# Patient Record
Sex: Female | Born: 1990 | Race: White | Hispanic: No | Marital: Married | Smoking: Former smoker
Health system: Southern US, Community
[De-identification: ages and names within clinical notes are randomized; demographics above are authoritative.]

## PROBLEM LIST (undated history)

## (undated) ENCOUNTER — Inpatient Hospital Stay: Payer: Self-pay

## (undated) DIAGNOSIS — D179 Benign lipomatous neoplasm, unspecified: Secondary | ICD-10-CM

## (undated) DIAGNOSIS — J45909 Unspecified asthma, uncomplicated: Secondary | ICD-10-CM

## (undated) DIAGNOSIS — G43909 Migraine, unspecified, not intractable, without status migrainosus: Secondary | ICD-10-CM

## (undated) DIAGNOSIS — R519 Headache, unspecified: Secondary | ICD-10-CM

## (undated) DIAGNOSIS — R51 Headache: Secondary | ICD-10-CM

## (undated) DIAGNOSIS — T7840XA Allergy, unspecified, initial encounter: Secondary | ICD-10-CM

## (undated) HISTORY — DX: Migraine, unspecified, not intractable, without status migrainosus: G43.909

## (undated) HISTORY — DX: Headache: R51

## (undated) HISTORY — DX: Benign lipomatous neoplasm, unspecified: D17.9

## (undated) HISTORY — DX: Headache, unspecified: R51.9

## (undated) HISTORY — DX: Unspecified asthma, uncomplicated: J45.909

## (undated) HISTORY — DX: Allergy, unspecified, initial encounter: T78.40XA

---
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
## 2009-09-01 ENCOUNTER — Ambulatory Visit: Payer: Self-pay | Admitting: Internal Medicine

## 2012-01-03 ENCOUNTER — Ambulatory Visit: Payer: Self-pay | Admitting: Internal Medicine

## 2012-06-06 ENCOUNTER — Emergency Department: Payer: Self-pay | Admitting: Emergency Medicine

## 2012-10-16 ENCOUNTER — Ambulatory Visit: Payer: Self-pay | Admitting: Family Medicine

## 2013-11-21 IMAGING — US ULTRASOUND RIGHT BREAST
1 series · 13 of 13 positions shown · non-contrast
Comparison: none

REASON FOR EXAM: RT BRST LUMP 7 OCLOCK
COMMENTS:

PROCEDURE:     US  - US BREAST RIGHT  - January 03, 2012 [DATE]
RESULT:     The patient reports an area of nodularity on the right at the
[DATE] to [DATE] position. The echotexture of the image parenchyma is normal. No
cystic or solid masses are demonstrated. There is no abnormal shadowing.

[Series 1: ultrasound right breast · 0.08mm/px · 13 of 13 slices shown]
[im 1/13]
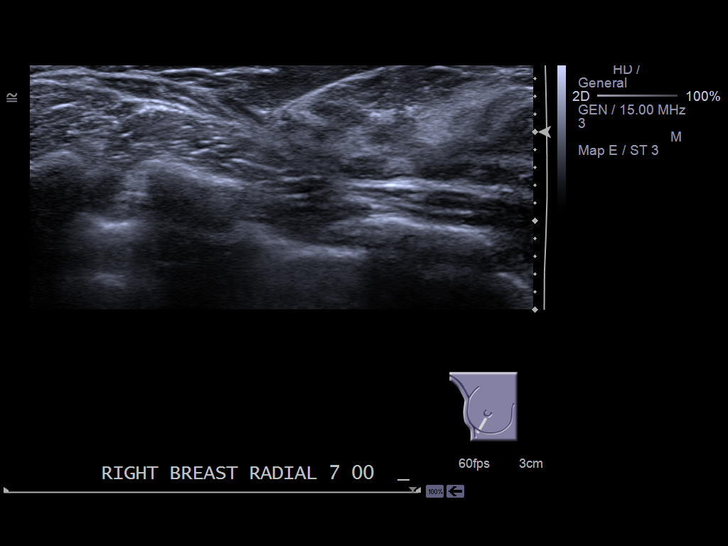
[im 2/13]
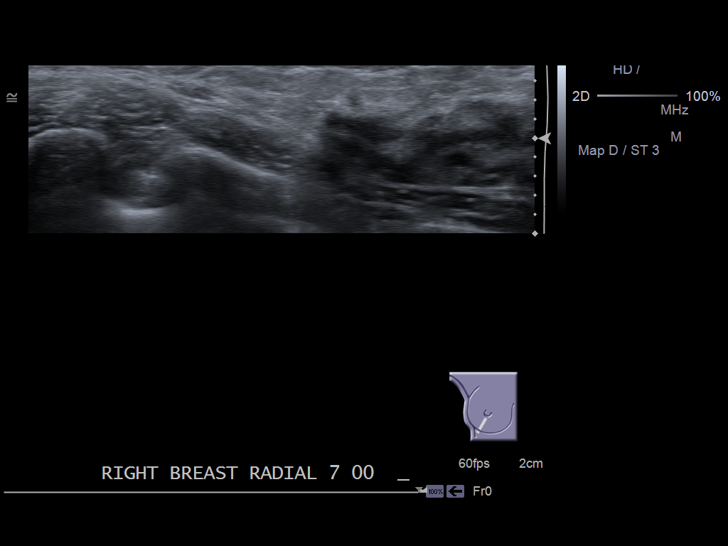
[im 3/13]
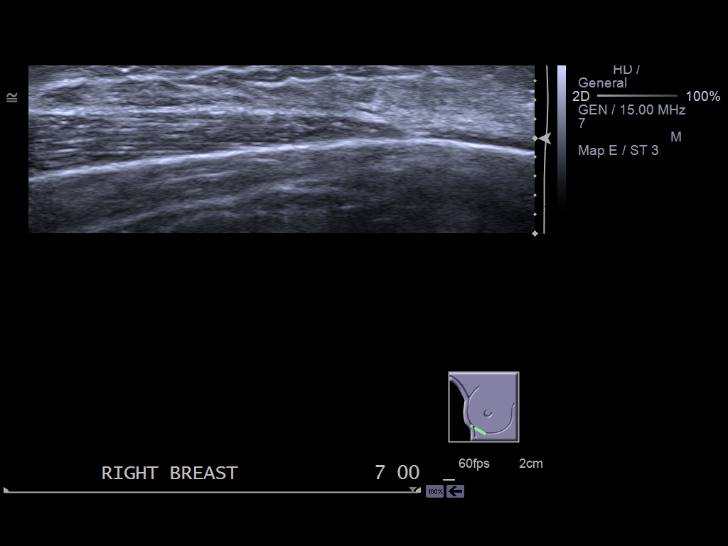
[im 4/13]
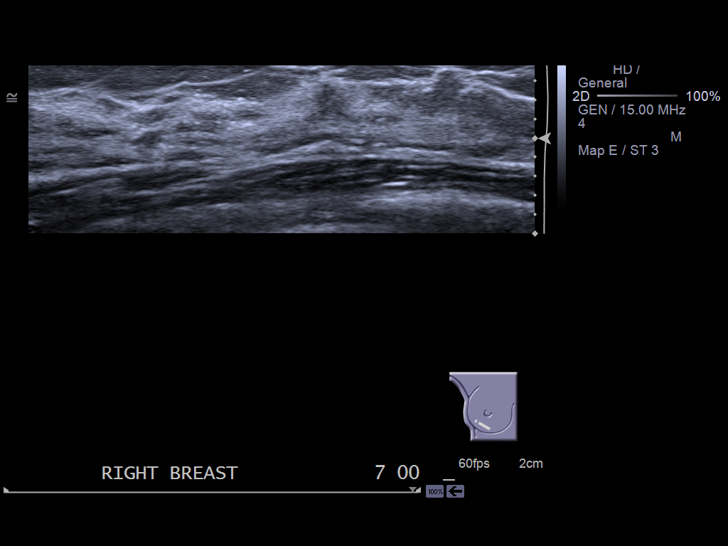
[im 5/13]
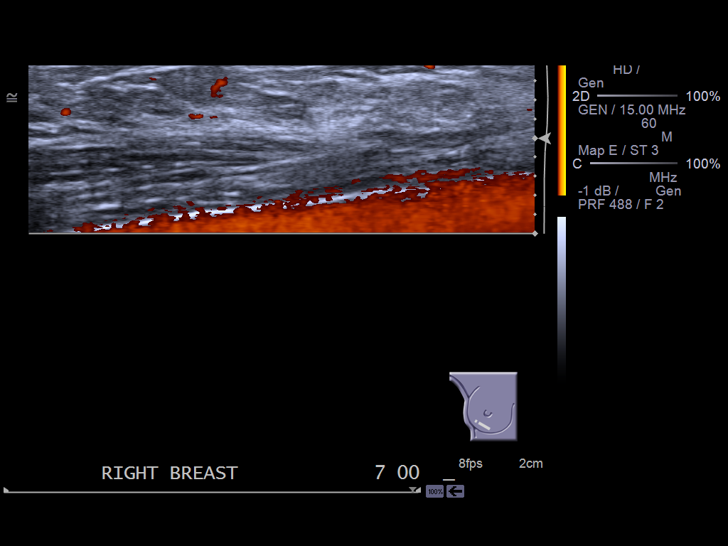
[im 6/13]
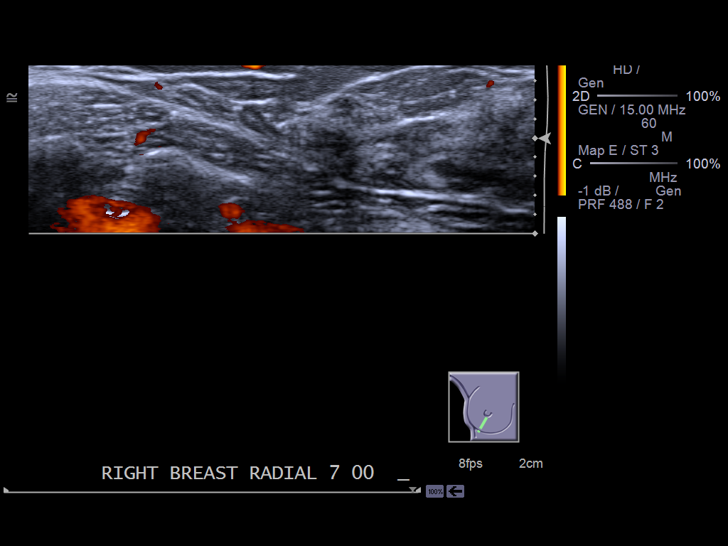
[im 7/13]
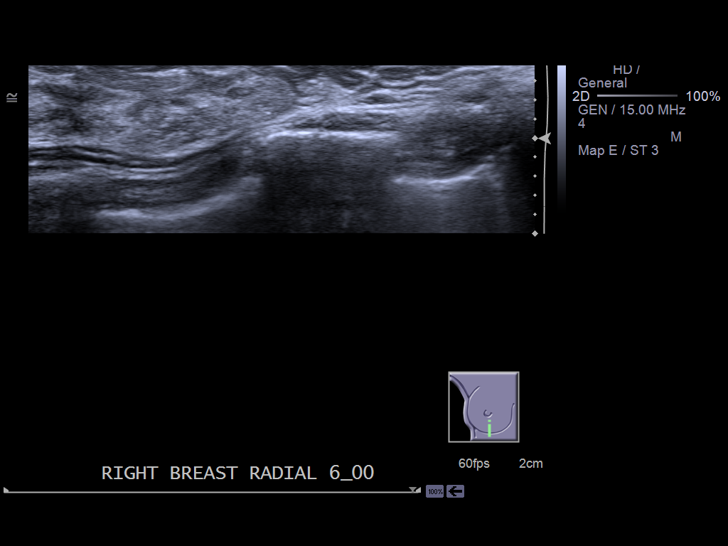
[im 8/13]
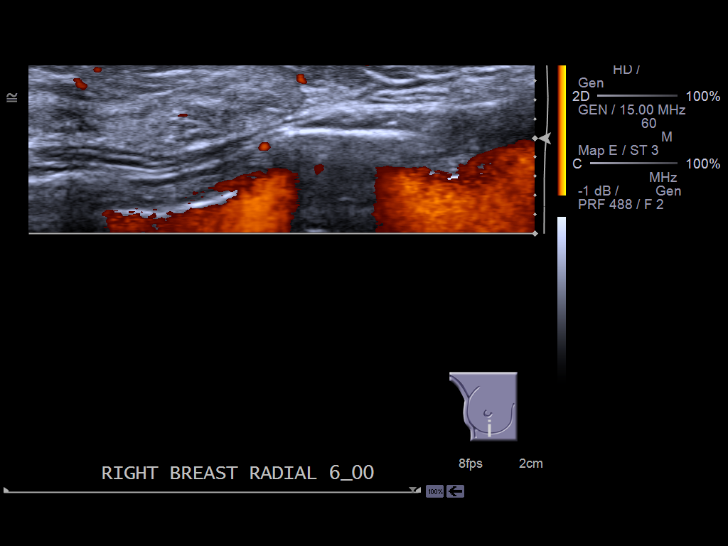
[im 9/13]
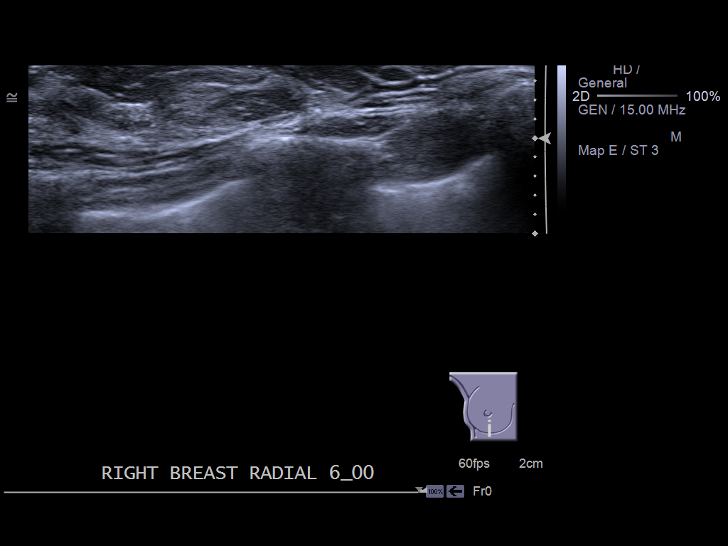
[im 10/13]
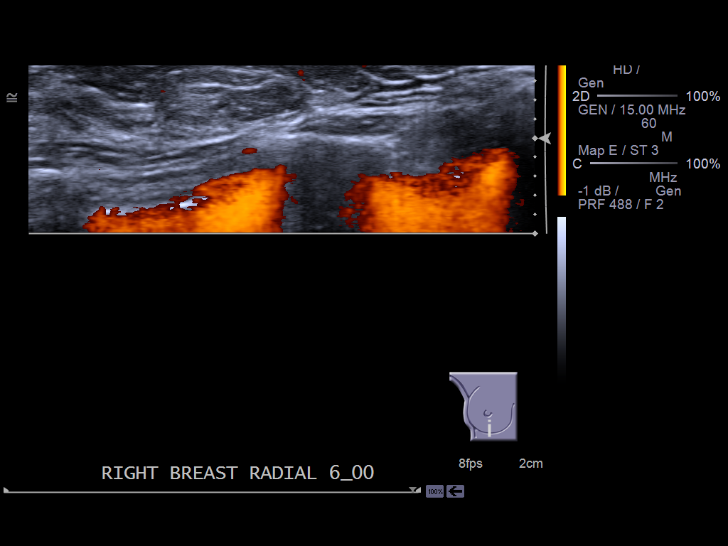
[im 11/13]
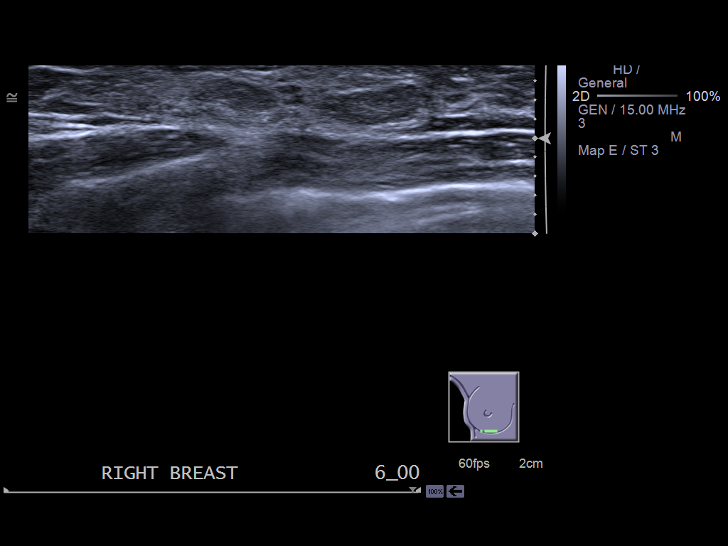
[im 12/13]
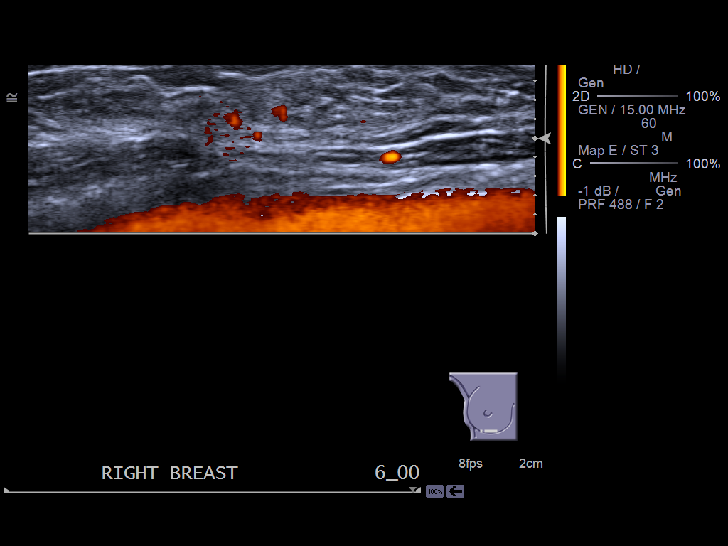
[im 13/13]
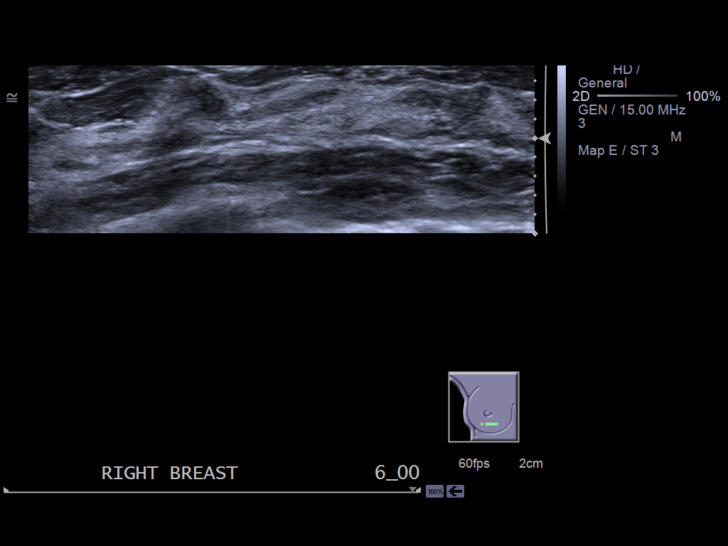

[13 of 13 positions shown; findings below may reference images not displayed]

IMPRESSION: No abnormality is demonstrated on this directed ultrasound
of the right breast. Continued close clinical followup is recommended.
Surgical consultation is recommended with consideration of percutaneous
biopsy of any persistent palpable finding.

[REDACTED]

## 2014-06-13 ENCOUNTER — Other Ambulatory Visit: Payer: Self-pay

## 2014-06-30 ENCOUNTER — Ambulatory Visit: Payer: Self-pay | Admitting: Certified Nurse Midwife

## 2014-06-30 LAB — HCG, QUANTITATIVE, PREGNANCY: Beta Hcg, Quant.: 3 m[IU]/mL

## 2015-04-05 HISTORY — PX: WISDOM TOOTH EXTRACTION: SHX21

## 2016-11-16 ENCOUNTER — Encounter: Payer: Self-pay | Admitting: Primary Care

## 2016-11-16 ENCOUNTER — Ambulatory Visit (INDEPENDENT_AMBULATORY_CARE_PROVIDER_SITE_OTHER): Payer: 59 | Admitting: Primary Care

## 2016-11-16 DIAGNOSIS — G43701 Chronic migraine without aura, not intractable, with status migrainosus: Secondary | ICD-10-CM | POA: Diagnosis not present

## 2016-11-16 DIAGNOSIS — G43909 Migraine, unspecified, not intractable, without status migrainosus: Secondary | ICD-10-CM | POA: Insufficient documentation

## 2016-11-16 NOTE — Assessment & Plan Note (Signed)
Chronic, intermittent. Recent, daily migraines since August 4th. Exam today unremarkable. Recommended a few treatment options including migraine cocktail in the clinic today and if no resolve then preventative, daily treatment. Since she will be seeing a neurologist later today then she will wait for treatment.

## 2016-11-16 NOTE — Progress Notes (Signed)
Subjective:    Patient ID: Tina Fischer, female    DOB: 1990/06/10, 26 y.o.   MRN: [id number]  HPI  Tina Fischer is a 26 year ol female who presents today to establish care and discuss the problems mentioned below. Will obtain old records.   1) Chronic Headaches/Migraines: Diagnosed at the age of 26. Currently managed on butalbital-acetaminophen-caffeine and sumatriptan for which she recently was prescribed at a walk-in clinic. Migraines and headaches have been intermittent for years, started back up in March 2017, then no headaches/migraines until August 4th 2018.   Since August 4th her migraines are occurring 2-3 times daily with nausea, vomiting, photophobia. She was evaluated at the walk in clinic and was provided with a 5 day steroid course with resolve of mirgaines until she ran out. She also was provided with butalbital-acetaminophen-caffeine and Sumatriptan. Neither of these medications have helped. She denies increased stress/anxiety, changes in soaps/detergents, new perfumes.   Her last migraine was this morning, still feels residual pain to her left neck. Migraines are typically located to the left occipital lobe, left parietal lobe, and left frontal lobe. She has an appointment with a Neurologist later today.   2) Asthma: Diagnosed in childhood. Doesn't experience symptoms as an adult. Will occasionally notice wheezing with vigorous exercise. Quit smoking in July 2018.  Review of Systems  Eyes: Positive for photophobia.  Respiratory: Negative for shortness of breath and wheezing.   Cardiovascular: Negative for chest pain.  Gastrointestinal: Positive for nausea and vomiting.  Neurological: Positive for headaches.       Past Medical History:  Diagnosis Date  . Allergy   . Asthma   . Frequent headaches   . Migraines      Social History   Social History  . Marital status: Single    Spouse name: N/A  . Number of children: N/A  . Years of education: N/A    Occupational History  . Not on file.   Social History Main Topics  . Smoking status: Former Research scientist (life sciences)  . Smokeless tobacco: Never Used  . Alcohol use Yes  . Drug use: Unknown  . Sexual activity: Not on file   Other Topics Concern  . Not on file   Social History Narrative   Married.   No children.   Works for [employer], yoga, Chiropodist.     Past Surgical History:  Procedure Laterality Date  . TONSILLECTOMY AND ADENOIDECTOMY  2008  . WISDOM TOOTH EXTRACTION  2017    Family History  Problem Relation Age of Onset  . Cancer Maternal Grandmother        breast  . Early death Maternal Grandmother   . Early death Maternal Grandfather   . Depression Paternal Grandmother   . Depression Paternal Grandfather   . Cancer Paternal Grandfather        Prostated    No Known Allergies  No current outpatient prescriptions on file prior to visit.   No current facility-administered medications on file prior to visit.     BP 104/70   Pulse 70   Temp 97.7 F (36.5 C) (Oral)   Ht 5' 4.5" (1.638 m)   Wt 117 lb 12.8 oz (53.4 kg)   LMP 10/28/2016   SpO2 98%   BMI 19.91 kg/m    Objective:   Physical Exam  Constitutional: She appears well-nourished.  Eyes: EOM are normal.  Neck: Neck supple.  Cardiovascular: Normal rate and regular rhythm.   Pulmonary/Chest:  Effort normal and breath sounds normal.  Neurological: No cranial nerve deficit.  Skin: Skin is warm and dry.  Psychiatric: She has a normal mood and affect.          Assessment & Plan:

## 2016-11-16 NOTE — Patient Instructions (Signed)
Follow up with Neurology as scheduled.  It was a pleasure to meet you today! Please don't hesitate to call me with any questions. Welcome to [facility]!

## 2016-12-13 ENCOUNTER — Telehealth: Payer: Self-pay

## 2016-12-13 DIAGNOSIS — J4520 Mild intermittent asthma, uncomplicated: Secondary | ICD-10-CM

## 2016-12-13 DIAGNOSIS — J3089 Other allergic rhinitis: Secondary | ICD-10-CM

## 2016-12-13 MED ORDER — ALBUTEROL SULFATE HFA 108 (90 BASE) MCG/ACT IN AERS: 2.0000 | INHALATION_SPRAY | RESPIRATORY_TRACT | 0 refills | Status: DC | PRN

## 2016-12-13 NOTE — Telephone Encounter (Signed)
Noted. Given her history of asthma and allergic rhinitis will send perception for albuterol inhaler to pharmacy. Please instruct her to use 2 puffs every 4-6 hours as needed for wheezing or shortness of breath. Please have her come see me if no improvement the next several days.

## 2016-12-13 NOTE — Telephone Encounter (Signed)
Pt said her allergies are causing a head cold with wheezing;pt has used albuterol inhaler which helps and pt request albuterol inhaler to [facility]. No fever, no cough and if pt worsens will schedule appt but does not want to schedule appt now. Please advise. Established care 11/16/16.

## 2016-12-14 NOTE — Telephone Encounter (Signed)
Spoken and notified patient of Kate's comments. Patient verbalized understanding. 

## 2017-11-16 ENCOUNTER — Encounter: Payer: Self-pay | Admitting: Primary Care

## 2017-11-16 ENCOUNTER — Ambulatory Visit (INDEPENDENT_AMBULATORY_CARE_PROVIDER_SITE_OTHER): Payer: 59 | Admitting: Primary Care

## 2017-11-16 ENCOUNTER — Other Ambulatory Visit (HOSPITAL_COMMUNITY)
Admission: RE | Admit: 2017-11-16 | Discharge: 2017-11-16 | Disposition: A | Discharge status: Home / Self Care | Payer: 59 | Source: Ambulatory Visit | Attending: Family Medicine | Admitting: Family Medicine

## 2017-11-16 VITALS — BP 106/70 | HR 66 | Temp 98.0°F | Ht 64.5 in | Wt 113.8 lb

## 2017-11-16 DIAGNOSIS — J3089 Other allergic rhinitis: Secondary | ICD-10-CM | POA: Diagnosis not present

## 2017-11-16 DIAGNOSIS — G43701 Chronic migraine without aura, not intractable, with status migrainosus: Secondary | ICD-10-CM | POA: Diagnosis not present

## 2017-11-16 DIAGNOSIS — Z124 Encounter for screening for malignant neoplasm of cervix: Secondary | ICD-10-CM | POA: Insufficient documentation

## 2017-11-16 DIAGNOSIS — J4520 Mild intermittent asthma, uncomplicated: Secondary | ICD-10-CM

## 2017-11-16 DIAGNOSIS — F909 Attention-deficit hyperactivity disorder, unspecified type: Secondary | ICD-10-CM

## 2017-11-16 DIAGNOSIS — J4530 Mild persistent asthma, uncomplicated: Secondary | ICD-10-CM | POA: Insufficient documentation

## 2017-11-16 DIAGNOSIS — J309 Allergic rhinitis, unspecified: Secondary | ICD-10-CM | POA: Insufficient documentation

## 2017-11-16 MED ORDER — MONTELUKAST SODIUM 10 MG PO TABS: ORAL_TABLET | ORAL | 0 refills | Status: DC

## 2017-11-16 MED ORDER — ALBUTEROL SULFATE HFA 108 (90 BASE) MCG/ACT IN AERS: 2.0000 | INHALATION_SPRAY | RESPIRATORY_TRACT | 0 refills | Status: DC | PRN

## 2017-11-16 NOTE — Assessment & Plan Note (Signed)
Daily symptoms since getting a kitten. Using Zyrtec and albuterol daily. Rx for Singulair sent to pharmacy. She will update if she continues to require frequent use of her albuterol, consider ICS inhaler at that point.

## 2017-11-16 NOTE — Progress Notes (Signed)
Subjective:    Patient ID: Tina Fischer, female    DOB: Aug 13, 1990, 27 y.o.   MRN: [id number]  HPI  Tina Fischer is a 27 year old female who presents today for follow up and pap smear.  1) Asthma: Managed on Zyrtec for which she takes daily. She is allergic to cat's, recently got a kitten. She's been using her albuterol inhaler several times daily for the last 40 days since she got her kitten. She'll also using prior to exercise. She'll experience wheezing, shortness of breath, and chest tightness before using inhaler.   2) Migraines: Currently managed on Sumatriptan for which she's not taken in nearly one year as she's had no migraine. She had an ear piercing about one year ago.   3) Screening for cervical cancer: Due for pap smear today. Her LMP was one week ago. She doing self breast exams weekly. She is on no birth control .   4) ADHD: History of since childhood, once managed on Adderall. 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 She denies formal testing, recently. She's been off of her medication for years.   Review of Systems  Constitutional: Negative for fever.  Respiratory: Positive for shortness of breath and wheezing.   Genitourinary: Negative for menstrual problem.  Neurological: Negative for headaches.       Past Medical History:  Diagnosis Date  . Allergy   . Asthma   . Frequent headaches   . Migraines      Social History   Socioeconomic History  . Marital status: Single    Spouse name: Not on file  . Number of children: Not on file  . Years of education: Not on file  . Highest education level: Not on file  Occupational History  . Not on file  Social Needs  . Financial resource strain: Not on file  . Food insecurity:    Worry: Not on file    Inability: Not on file  . Transportation needs:    Medical: Not on file    Non-medical: Not on file  Tobacco Use  . Smoking status: Former Research scientist (life sciences)  .  Smokeless tobacco: Never Used  Substance and Sexual Activity  . Alcohol use: Yes  . Drug use: Not on file  . Sexual activity: Not on file  Lifestyle  . Physical activity:    Days per week: Not on file    Minutes per session: Not on file  . Stress: Not on file  Relationships  . Social connections:    Talks on phone: Not on file    Gets together: Not on file    Attends religious service: Not on file    Active member of club or organization: Not on file    Attends meetings of clubs or organizations: Not on file    Relationship status: Not on file  . Intimate partner violence:    Fear of current or ex partner: Not on file    Emotionally abused: Not on file    Physically abused: Not on file    Forced sexual activity: Not on file  Other Topics Concern  . Not on file  Social History Narrative   Married.   No children.   Works for [employer], yoga, Chiropodist.      Family History  Problem Relation Age of Onset  . Cancer Maternal Grandmother        breast  .  Early death Maternal Grandmother   . Early death Maternal Grandfather   . Depression Paternal Grandmother   . Depression Paternal Grandfather   . Cancer Paternal Grandfather        Prostated    No Known Allergies  Current Outpatient Medications on File Prior to Visit  Medication Sig Dispense Refill  . cetirizine (ZYRTEC) 10 MG tablet Take by mouth.    . SUMAtriptan (IMITREX) 50 MG tablet Take 1 tablet once daily as needed for migraine for up 1 dose. May take 2nd after 2 hours if needed     No current facility-administered medications on file prior to visit.     BP 106/70   Pulse 66   Temp 98 F (36.7 C) (Oral)   Ht 5' 4.5" (1.638 m)   Wt 113 lb 12 oz (51.6 kg)   LMP 11/13/2017   SpO2 99%   BMI 19.22 kg/m    Objective:   Physical Exam  Constitutional: She appears well-nourished.  Neck: Neck supple.  Cardiovascular: Normal rate and regular rhythm.  Respiratory: Effort normal and  breath sounds normal.  Genitourinary: Cervix exhibits no motion tenderness and no discharge. Right adnexum displays no tenderness. Left adnexum displays no tenderness. No erythema in the vagina. No vaginal discharge found.  Skin: Skin is warm and dry.  Psychiatric: She has a normal mood and affect.           Assessment & Plan:  Screening for Cervical Cancer:  Pap smear completed today. She completes monthly breast exams. Menstrual cycles regular.  No birth control.   Tina Koch, NP

## 2017-11-16 NOTE — Patient Instructions (Signed)
Your asthma is not well controlled.  Start montelukast (Singular) 10 mg tablets for allergies and asthma.   Use the albuterol inhaler as needed for wheezing and shortness of breath. Please call me if you continue to require use of your inhaler more than three times weekly.  We will be in touch once we receive your pap smear results.   It was a pleasure to see you today!

## 2017-11-16 NOTE — Assessment & Plan Note (Signed)
Endorses history of since childhood, last management with Adderall was in 2014. Records in [place]. She will start school to see how things go. If interested in restarting medication, will send for formal testing.

## 2017-11-16 NOTE — Assessment & Plan Note (Signed)
No migraine in nearly one year since ear piercing. Continue to monitor.

## 2017-11-17 LAB — CYTOLOGY - PAP: Diagnosis: NEGATIVE

## 2017-11-20 ENCOUNTER — Encounter: Payer: Self-pay | Admitting: *Deleted

## 2018-01-15 ENCOUNTER — Telehealth: Payer: Self-pay

## 2018-01-15 NOTE — Telephone Encounter (Signed)
Copied from Linda [id number]. 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  Pt needs chicken pox vaccination and meningococcal vaccination.  Pt can be reached at [phone]

## 2018-01-15 NOTE — Telephone Encounter (Signed)
Does pt need titer or just schedule nurse visit for injections? Pt last seen 11/16/17 and copy of [facility] in Kate's inbox.

## 2018-01-16 NOTE — Telephone Encounter (Signed)
Reviewed immunization history and don't see evidence of either vaccination. We can either test her for immunity or give the vaccinations, I'll leave this up to her. Let me know.

## 2018-01-16 NOTE — Telephone Encounter (Signed)
Pt states she needs to get these vaccines or she cannot continue dental school, so doing a lab is out of the question. Pt states no, she never got any of these vaccines as a child.  Pt would like to come in on Friday, at 11 am. No other times available next week. It that OK? Pt states she needs to get them by then if that will work for you.

## 2018-01-16 NOTE — Telephone Encounter (Signed)
Vaccines are fine with me, please call her back for scheduling.

## 2018-01-16 NOTE — Telephone Encounter (Signed)
Patient calling and states that she received a call, but not sure what it was regarding. Please advise. Patient would like to have the vaccines on Friday 01/19/18 at 11:00am, if that is okay with K. Clark.

## 2018-01-17 NOTE — Telephone Encounter (Signed)
I spoke with pt and scheduled nurse visit on 01/25/18 at [facility] for varicella and meningococcal vaccines. Pt may ck with instructor to see if can have varicella titer; if so pt will cb for lab appt.

## 2018-01-18 ENCOUNTER — Ambulatory Visit: Payer: Self-pay | Admitting: Family Medicine

## 2018-01-25 ENCOUNTER — Encounter (INDEPENDENT_AMBULATORY_CARE_PROVIDER_SITE_OTHER): Payer: Self-pay

## 2018-01-25 ENCOUNTER — Ambulatory Visit: Payer: Self-pay | Admitting: *Deleted

## 2018-01-25 DIAGNOSIS — Z23 Encounter for immunization: Secondary | ICD-10-CM

## 2018-02-15 ENCOUNTER — Other Ambulatory Visit: Payer: Self-pay | Admitting: Primary Care

## 2018-02-15 DIAGNOSIS — J4520 Mild intermittent asthma, uncomplicated: Secondary | ICD-10-CM

## 2018-02-15 DIAGNOSIS — J3089 Other allergic rhinitis: Secondary | ICD-10-CM

## 2018-02-16 ENCOUNTER — Ambulatory Visit: Payer: Self-pay | Admitting: Primary Care

## 2018-02-23 ENCOUNTER — Ambulatory Visit: Payer: Self-pay | Admitting: Primary Care

## 2018-02-23 ENCOUNTER — Encounter: Payer: Self-pay | Admitting: Primary Care

## 2018-02-23 VITALS — BP 118/72 | HR 86 | Temp 98.1°F | Ht 64.5 in | Wt 117.5 lb

## 2018-02-23 DIAGNOSIS — Z23 Encounter for immunization: Secondary | ICD-10-CM

## 2018-02-23 DIAGNOSIS — D171 Benign lipomatous neoplasm of skin and subcutaneous tissue of trunk: Secondary | ICD-10-CM

## 2018-02-23 DIAGNOSIS — D179 Benign lipomatous neoplasm, unspecified: Secondary | ICD-10-CM | POA: Insufficient documentation

## 2018-02-23 NOTE — Progress Notes (Signed)
Subjective:    Patient ID: Tina Fischer, female    DOB: 08-01-1990, 27 y.o.   MRN: [id number]  HPI  Tina Fischer is a 27 year old female who presents today with a chief complaint of skin mass. The mass is located to the right upper back for which she noticed in October this year. Her husband has noticed it over the past one year. Over the past 2 weeks she's noticed the mass grow in size.   She denies pain, itching, redness.   Review of Systems  Constitutional: Negative for fever.  Skin: Negative for color change and wound.       Skin mass       Past Medical History:  Diagnosis Date  . Allergy   . Asthma   . Frequent headaches   . Migraines      Social History   Socioeconomic History  . Marital status: Single    Spouse name: Not on file  . Number of children: Not on file  . Years of education: Not on file  . Highest education level: Not on file  Occupational History  . Not on file  Social Needs  . Financial resource strain: Not on file  . Food insecurity:    Worry: Not on file    Inability: Not on file  . Transportation needs:    Medical: Not on file    Non-medical: Not on file  Tobacco Use  . Smoking status: Former Research scientist (life sciences)  . Smokeless tobacco: Never Used  Substance and Sexual Activity  . Alcohol use: Yes  . Drug use: Not on file  . Sexual activity: Not on file  Lifestyle  . Physical activity:    Days per week: Not on file    Minutes per session: Not on file  . Stress: Not on file  Relationships  . Social connections:    Talks on phone: Not on file    Gets together: Not on file    Attends religious service: Not on file    Active member of club or organization: Not on file    Attends meetings of clubs or organizations: Not on file    Relationship status: Not on file  . Intimate partner violence:    Fear of current or ex partner: Not on file    Emotionally abused: Not on file    Physically abused: Not on file    Forced sexual activity: Not on file    Other Topics Concern  . Not on file  Social History Narrative   Married.   No children.   Works for [employer], yoga, Chiropodist.     Family History  Problem Relation Age of Onset  . Cancer Maternal Grandmother        breast  . Early death Maternal Grandmother   . Early death Maternal Grandfather   . Depression Paternal Grandmother   . Depression Paternal Grandfather   . Cancer Paternal Grandfather        Prostated    No Known Allergies  Current Outpatient Medications on File Prior to Visit  Medication Sig Dispense Refill  . albuterol (PROVENTIL HFA;VENTOLIN HFA) 108 (90 Base) MCG/ACT inhaler Inhale 2 puffs into the lungs every 4 (four) hours as needed for wheezing or shortness of breath. 1 Inhaler 0  . cetirizine (ZYRTEC) 10 MG tablet Take by mouth.    . montelukast (SINGULAIR) 10 MG tablet TAKE 1 TABLET BY MOUTH AT BEDTIME FOR ALLERGIES  AND ASTHMA. 90 tablet 0  . SUMAtriptan (IMITREX) 50 MG tablet Take 1 tablet once daily as needed for migraine for up 1 dose. May take 2nd after 2 hours if needed     No current facility-administered medications on file prior to visit.     BP 118/72   Pulse 86   Temp 98.1 F (36.7 C) (Oral)   Ht 5' 4.5" (1.638 m)   Wt 117 lb 8 oz (53.3 kg)   LMP 02/11/2018   SpO2 98%   BMI 19.86 kg/m    Objective:   Physical Exam  Constitutional: She appears well-nourished.  Cardiovascular: Normal rate and regular rhythm.  Respiratory: Effort normal and breath sounds normal.  Skin: Skin is warm and dry. No erythema.  0.5 cm rounded, soft superficial mass to right mid/lower scapular region.  Nontender.           Assessment & Plan:

## 2018-02-23 NOTE — Patient Instructions (Signed)
The mass on your back is likely a benign cyst.  Lipoma A lipoma is a noncancerous (benign) tumor that is made up of fat cells. This is a very common type of soft-tissue growth. Lipomas are usually found under the skin (subcutaneous). They may occur in any tissue of the body that contains fat. Common areas for lipomas to appear include the back, shoulders, buttocks, and thighs. Lipomas grow slowly, and they are usually painless. Most lipomas do not cause problems and do not require treatment. What are the causes? The cause of this condition is not known. What increases the risk? This condition is more likely to develop in:  People who are 64-46 years old.  People who have a family history of lipomas.  What are the signs or symptoms? A lipoma usually appears as a small, round bump under the skin. It may feel soft or rubbery, but the firmness can vary. Most lipomas are not painful. However, a lipoma may become painful if it is located in an area where it pushes on nerves. How is this diagnosed? A lipoma can usually be diagnosed with a physical exam. You may also have tests to confirm the diagnosis and to rule out other conditions. Tests may include:  Imaging tests, such as a CT scan or MRI.  Removal of a tissue sample to be looked at under a microscope (biopsy).  How is this treated? Treatment is not needed for small lipomas that are not causing problems. If a lipoma continues to get bigger or it causes problems, removal is often the best option. Lipomas can also be removed to improve appearance. Removal of a lipoma is usually done with a surgery in which the fatty cells and the surrounding capsule are removed. Most often, a medicine that numbs the area (local anesthetic) is used for this procedure. Follow these instructions at home:  Keep all follow-up visits as directed by your health care provider. This is important. Contact a health care provider if:  Your lipoma becomes larger or  hard.  Your lipoma becomes painful, red, or increasingly swollen. These could be signs of infection or a more serious condition. This information is not intended to replace advice given to you by your health care provider. Make sure you discuss any questions you have with your health care provider. Document Released: 03/11/2002 Document Revised: 08/27/2015 Document Reviewed: 03/17/2014 Elsevier Interactive Patient Education  [employer].

## 2018-02-23 NOTE — Assessment & Plan Note (Addendum)
Located to posterior trunk at right scapular region.  Exam today representative of benign cyst/lipoma.  Reassurance provided.  Continue to monitor.

## 2018-07-09 ENCOUNTER — Other Ambulatory Visit: Payer: Self-pay | Admitting: Primary Care

## 2018-07-09 DIAGNOSIS — J4520 Mild intermittent asthma, uncomplicated: Secondary | ICD-10-CM

## 2018-07-09 DIAGNOSIS — J3089 Other allergic rhinitis: Secondary | ICD-10-CM

## 2018-10-10 ENCOUNTER — Other Ambulatory Visit (HOSPITAL_COMMUNITY)
Admission: RE | Admit: 2018-10-10 | Discharge: 2018-10-10 | Disposition: A | Discharge status: Home / Self Care | Payer: Medicaid Other | Source: Ambulatory Visit | Attending: Obstetrics and Gynecology | Admitting: Obstetrics and Gynecology

## 2018-10-10 ENCOUNTER — Ambulatory Visit: Payer: Self-pay | Admitting: Obstetrics and Gynecology

## 2018-10-10 ENCOUNTER — Encounter: Payer: Self-pay | Admitting: Obstetrics and Gynecology

## 2018-10-10 ENCOUNTER — Other Ambulatory Visit: Payer: Self-pay

## 2018-10-10 VITALS — BP 132/75 | HR 86 | Wt 131.0 lb

## 2018-10-10 DIAGNOSIS — N912 Amenorrhea, unspecified: Secondary | ICD-10-CM

## 2018-10-10 DIAGNOSIS — Z113 Encounter for screening for infections with a predominantly sexual mode of transmission: Secondary | ICD-10-CM | POA: Insufficient documentation

## 2018-10-10 DIAGNOSIS — J45909 Unspecified asthma, uncomplicated: Secondary | ICD-10-CM

## 2018-10-10 DIAGNOSIS — O99511 Diseases of the respiratory system complicating pregnancy, first trimester: Secondary | ICD-10-CM | POA: Diagnosis not present

## 2018-10-10 DIAGNOSIS — Z3A01 Less than 8 weeks gestation of pregnancy: Secondary | ICD-10-CM | POA: Insufficient documentation

## 2018-10-10 DIAGNOSIS — Z3401 Encounter for supervision of normal first pregnancy, first trimester: Secondary | ICD-10-CM | POA: Insufficient documentation

## 2018-10-10 DIAGNOSIS — Z3201 Encounter for pregnancy test, result positive: Secondary | ICD-10-CM

## 2018-10-10 DIAGNOSIS — O99519 Diseases of the respiratory system complicating pregnancy, unspecified trimester: Secondary | ICD-10-CM

## 2018-10-10 DIAGNOSIS — Z3400 Encounter for supervision of normal first pregnancy, unspecified trimester: Secondary | ICD-10-CM | POA: Insufficient documentation

## 2018-10-10 HISTORY — DX: Unspecified asthma, uncomplicated: J45.909

## 2018-10-10 HISTORY — DX: Diseases of the respiratory system complicating pregnancy, unspecified trimester: O99.519

## 2018-10-10 LAB — POCT URINE PREGNANCY: Preg Test, Ur: POSITIVE — AB

## 2018-10-10 NOTE — Progress Notes (Signed)
New Obstetric Patient H&P   Chief Complaint: "Desires prenatal care"   History of Present Illness: Patient is a 28 y.o. G2P0010 Not Hispanic or [place] female, LMP 08/26/2018 presents with amenorrhea and positive home pregnancy test. Based on her  LMP, her EDD is Estimated Date of Delivery: 06/02/2019 and her EGA is [redacted]w[redacted]d. Cycles are 4. days, regular, and occur approximately every : 28 days. Her last pap smear was 11/16/2017 and was no abnormalities.    She had a urine pregnancy test which was positive 3 or 4 week(s)  ago. Her last menstrual period was normal and lasted for  3 day(s). Since her LMP she claims she has experienced bloating and constipation along with stomach cramps. She denies vaginal bleeding. Her past medical history is notable for asthma. Her prior pregnancies are notable for early miscarriage  Since her LMP, she admits to the use of tobacco products  no She claims she has gained 7 pounds since the start of her pregnancy.  There are cats in the home in the home  yes If yes Indoor She admits close contact with children on a regular basis  yes  She has had chicken pox in the past yes She has had Tuberculosis exposures, symptoms, or previously tested positive for TB   no Current or past history of domestic violence. no  Genetic Screening/Teratology Counseling: (Includes patient, baby's father, or anyone in either family with:)   75. Patient's age >/= 20 [facility]  no 2. Thalassemia ([place], [place], [place], or Asian background): MCV<80  no 3. Neural tube defect (meningomyelocele, spina bifida, anencephaly)  no 4. Congenital heart defect  no  5. Down syndrome  no 6. Tay-Sachs (Jewish, [place])  no 7. Canavan's Disease  no 8. Sickle cell disease or trait (African)  no  9. Hemophilia or other blood disorders  no  10. Muscular dystrophy  no  11. Cystic fibrosis  no  12. Huntington's Chorea  no  13. Mental retardation/autism  no 14. Other inherited genetic or  chromosomal disorder  no 15. Maternal metabolic disorder (DM, PKU, etc)  no 16. Patient or FOB with a child with a birth defect not listed above no  16a. Patient or FOB with a birth defect themselves no 17. Recurrent pregnancy loss, or stillbirth  no  18. Any medications since LMP other than prenatal vitamins (include vitamins, supplements, OTC meds, drugs, alcohol)  no 19. Any other genetic/environmental exposure to discuss  no  Infection History:   1. Lives with someone with TB or TB exposed  no  2. Patient or partner has history of genital herpes  no 3. Rash or viral illness since LMP  no 4. History of STI (GC, CT, HPV, syphilis, HIV)  no 5. History of recent travel :  no  Other pertinent information:  no    Review of Systems:10 point review of systems negative unless otherwise noted in HPI  Past Medical History:  Diagnosis Date  . Allergy   . Asthma   . Frequent headaches   . Lipoma   . Migraines     Past Surgical History:  Procedure Laterality Date  . TONSILLECTOMY AND ADENOIDECTOMY  2008  . WISDOM TOOTH EXTRACTION  2017    Gynecologic History: Patient's last menstrual period was 08/26/2018 (exact date).  Obstetric History: G2P0010  Family History  Problem Relation Age of Onset  . Cancer Maternal Grandmother 66       breast  . Early death Maternal Grandmother   .  Early death Maternal Grandfather   . Depression Paternal Grandmother   . Depression Paternal Grandfather   . Cancer Paternal Grandfather 72       Prostated    Social History   Socioeconomic History  . Marital status: Married    Spouse name: Not on file  . Number of children: Not on file  . Years of education: Not on file  . Highest education level: Not on file  Occupational History  . Occupation: Unemployed  . Occupation: Airline pilot  Social Needs  . Financial resource strain: Not on file  . Food insecurity    Worry: Not on file    Inability: Not on file  . Transportation  needs    Medical: Not on file    Non-medical: Not on file  Tobacco Use  . Smoking status: Former Smoker    Types: Cigarettes    Quit date: 05/23/2018    Years since quitting: 0.3  . Smokeless tobacco: Never Used  Substance and Sexual Activity  . Alcohol use: Yes    Comment: Occ  . Drug use: Yes    Types: Marijuana    Comment: Stopped with current pregnancy 2020  . Sexual activity: Yes    Birth control/protection: None  Lifestyle  . Physical activity    Days per week: Not on file    Minutes per session: Not on file  . Stress: Not on file  Relationships  . Social Herbalist on phone: Not on file    Gets together: Not on file    Attends religious service: Not on file    Active member of club or organization: Not on file    Attends meetings of clubs or organizations: Not on file    Relationship status: Not on file  . Intimate partner violence    Fear of current or ex partner: Not on file    Emotionally abused: Not on file    Physically abused: Not on file    Forced sexual activity: Not on file  Other Topics Concern  . Not on file  Social History Narrative   Married.   No children.   Works for [employer], yoga, Chiropodist.    Allergies: No Known Allergies  Prior to Admission medications   Medication Sig Start Date End Date Taking? Authorizing Provider  albuterol (PROVENTIL HFA;VENTOLIN HFA) 108 (90 Base) MCG/ACT inhaler Inhale 2 puffs into the lungs every 4 (four) hours as needed for wheezing or shortness of breath. 11/16/17   Pleas Koch, NP  cetirizine (ZYRTEC) 10 MG tablet Take by mouth.    [provider]  montelukast (SINGULAIR) 10 MG tablet TAKE 1 TABLET BY MOUTH AT BEDTIME FOR ALLERGIES AND ASTHMA. 07/09/18   Pleas Koch, NP  SUMAtriptan (IMITREX) 50 MG tablet Take 1 tablet once daily as needed for migraine for up 1 dose. May take 2nd after 2 hours if needed    [provider]    Physical Exam BP  132/75   Pulse 86   Wt 131 lb (59.4 kg)   LMP 08/26/2018 (Exact Date)   BMI 22.14 kg/m   Physical Exam Exam conducted with a chaperone present.  Constitutional:      General: She is not in acute distress.    Appearance: Normal appearance.  HENT:     Head: Normocephalic and atraumatic.  Eyes:     General: No scleral icterus.    Conjunctiva/sclera: Conjunctivae normal.  Neck:     Musculoskeletal: Normal range of motion and neck supple. No neck rigidity.  Cardiovascular:     Rate and Rhythm: Normal rate and regular rhythm.     Heart sounds: No murmur. No friction rub. No gallop.   Pulmonary:     Effort: Pulmonary effort is normal.     Breath sounds: Normal breath sounds. No wheezing, rhonchi or rales.  Abdominal:     General: There is no distension.     Palpations: Abdomen is soft.     Tenderness: There is no abdominal tenderness. There is no right CVA tenderness, left CVA tenderness or rebound.     Hernia: There is no hernia in the left inguinal area or right inguinal area.  Genitourinary:    General: Normal vulva.     Exam position: Lithotomy position.     Tanner stage (genital): 5.     Labia:        Right: No rash, tenderness, lesion or injury.        Left: No rash, tenderness, lesion or injury.      Vagina: Normal.     Cervix: Normal.     Uterus: Normal.      Adnexa: Right adnexa normal and left adnexa normal.  Musculoskeletal: Normal range of motion.        General: No swelling or tenderness.  Skin:    General: Skin is warm and dry.  Neurological:     General: No focal deficit present.     Mental Status: She is alert and oriented to person, place, and time.     Cranial Nerves: No cranial nerve deficit.  Psychiatric:        Mood and Affect: Mood normal.        Behavior: Behavior normal.        Judgment: Judgment normal.     Female Chaperone present during breast and/or pelvic exam.    Assessment: 28 y.o. G2P0010 at [redacted]w[redacted]d presenting to initiate prenatal  care  Plan: 1) Avoid alcoholic beverages. 2) Patient encouraged not to smoke.  3) Discontinue the use of all non-medicinal drugs and chemicals.  4) Take prenatal vitamins daily.  5) Nutrition, food safety (fish, cheese advisories, and high nitrite foods) and exercise discussed. 6) Hospital and practice style discussed with cross coverage system.  7) Genetic Screening, such as with 1st Trimester Screening, cell free fetal DNA, AFP testing, and Ultrasound, as well as with amniocentesis and CVS as appropriate, is discussed with patient. At the conclusion of today's visit patient undecided genetic testing 8) Patient is asked about travel to areas at risk for the [place] virus, and counseled to avoid travel and exposure to mosquitoes or sexual partners who may have themselves been exposed to the virus. Testing is discussed, and will be ordered as appropriate.   Prentice Docker, MD 10/10/2018 10:21 AM

## 2018-10-12 LAB — CERVICOVAGINAL ANCILLARY ONLY
Chlamydia: NEGATIVE
Neisseria Gonorrhea: NEGATIVE

## 2018-10-13 ENCOUNTER — Other Ambulatory Visit: Payer: Self-pay | Admitting: Primary Care

## 2018-10-13 DIAGNOSIS — J3089 Other allergic rhinitis: Secondary | ICD-10-CM

## 2018-10-13 DIAGNOSIS — J4520 Mild intermittent asthma, uncomplicated: Secondary | ICD-10-CM

## 2018-10-13 LAB — URINE CULTURE: Organism ID, Bacteria: NO GROWTH

## 2018-10-16 ENCOUNTER — Ambulatory Visit (INDEPENDENT_AMBULATORY_CARE_PROVIDER_SITE_OTHER): Payer: Self-pay | Admitting: Obstetrics and Gynecology

## 2018-10-16 ENCOUNTER — Other Ambulatory Visit: Payer: Self-pay

## 2018-10-16 ENCOUNTER — Encounter: Payer: Self-pay | Admitting: Obstetrics and Gynecology

## 2018-10-16 VITALS — BP 122/74 | Wt 132.0 lb

## 2018-10-16 DIAGNOSIS — Z3401 Encounter for supervision of normal first pregnancy, first trimester: Secondary | ICD-10-CM

## 2018-10-16 DIAGNOSIS — O99511 Diseases of the respiratory system complicating pregnancy, first trimester: Secondary | ICD-10-CM

## 2018-10-16 DIAGNOSIS — Z3A01 Less than 8 weeks gestation of pregnancy: Secondary | ICD-10-CM

## 2018-10-16 DIAGNOSIS — J45909 Unspecified asthma, uncomplicated: Secondary | ICD-10-CM

## 2018-10-16 DIAGNOSIS — O99519 Diseases of the respiratory system complicating pregnancy, unspecified trimester: Secondary | ICD-10-CM

## 2018-10-16 NOTE — Progress Notes (Signed)
  Routine Prenatal Care Visit  Subjective  Tina Fischer is a 28 y.o. G2P0010 at [redacted]w[redacted]d being seen today for ongoing prenatal care.  She is currently monitored for the following issues for this low-risk pregnancy and has Migraines; Mild intermittent asthma without complication; Allergic rhinitis due to allergen; ADHD; Lipoma; Supervision of low-risk first pregnancy; and Asthma during pregnancy on their problem list.  ----------------------------------------------------------------------------------- Patient reports no complaints.    . Vag. Bleeding: None.   . Denies leaking of fluid.  U/S confirms dating. ----------------------------------------------------------------------------------- The following portions of the patient's history were reviewed and updated as appropriate: allergies, current medications, past family history, past medical history, past social history, past surgical history and problem list. Problem list updated.   Objective  Blood pressure 122/74, weight 132 lb (59.9 kg), last menstrual period 08/26/2018. Pregravid weight 125 lb (56.7 kg) Total Weight Gain 7 lb (3.175 kg) Urinalysis: Urine Protein    Urine Glucose    Fetal Status: Fetal Heart Rate (bpm): 163         General:  Alert, oriented and cooperative. Patient is in no acute distress.  Skin: Skin is warm and dry. No rash noted.   Cardiovascular: Normal heart rate noted  Respiratory: Normal respiratory effort, no problems with respiration noted  Abdomen: Soft, gravid, appropriate for gestational age. Pain/Pressure: Absent     Pelvic:  Cervical exam deferred        Extremities: Normal range of motion.     Mental Status: Normal mood and affect. Normal behavior. Normal judgment and thought content.   Bedside transvaginal ultrasound: single, living intrauterine pregnancy, CRL 1.29 CM which gives gestational age of [redacted]w[redacted]d. Yolk sac present and measures 3.3 mm.  Pregnancy is fundal.  Cardiac activity is present at 163  bpm by M mode.  Adnexal visualized and no masses appreciated. Trace anterior and posterior cul-de-sac free fluid noted.  No abnormalities noted.  Ultrasound performed by me.  Female chaperone present.   Assessment   28 y.o. G2P0010 at [redacted]w[redacted]d by  06/02/2019, by Last Menstrual Period presenting for routine prenatal visit  Plan   Pregnancy#2 Problems (from 08/26/18 to present)    Problem Noted Resolved   Supervision of low-risk first pregnancy 10/10/2018 by Will Bonnet, MD No   Asthma during pregnancy 10/10/2018 by Will Bonnet, MD No       Preterm labor symptoms and general obstetric precautions including but not limited to vaginal bleeding, contractions, leaking of fluid and fetal movement were reviewed in detail with the patient. Please refer to After Visit Summary for other counseling recommendations.   Return in about 4 weeks (around 11/13/2018) for Routine Prenatal Appointment.  Patient still has no insurance. From old records, her blood type is A+. Still working on [employer].  Once she gets this, can get basic NOB labs.   Prentice Docker, MD, [facility], [facility] 10/16/2018 5:46 PM

## 2018-10-19 ENCOUNTER — Encounter: Payer: Self-pay | Admitting: Maternal Newborn

## 2018-11-16 ENCOUNTER — Ambulatory Visit (INDEPENDENT_AMBULATORY_CARE_PROVIDER_SITE_OTHER): Payer: Medicaid Other | Admitting: Obstetrics & Gynecology

## 2018-11-16 ENCOUNTER — Encounter: Payer: Self-pay | Admitting: Obstetrics & Gynecology

## 2018-11-16 ENCOUNTER — Other Ambulatory Visit: Payer: Self-pay

## 2018-11-16 VITALS — BP 110/60 | Wt 134.0 lb

## 2018-11-16 DIAGNOSIS — Z3401 Encounter for supervision of normal first pregnancy, first trimester: Secondary | ICD-10-CM | POA: Diagnosis not present

## 2018-11-16 DIAGNOSIS — Z3A11 11 weeks gestation of pregnancy: Secondary | ICD-10-CM

## 2018-11-16 LAB — POCT URINALYSIS DIPSTICK OB
Glucose, UA: NEGATIVE
POC,PROTEIN,UA: NEGATIVE

## 2018-11-16 NOTE — Patient Instructions (Signed)

## 2018-11-16 NOTE — Progress Notes (Signed)
  Subjective  Min nausea, no pain Labs today  Objective  BP 110/60   Wt 134 lb (60.8 kg)   LMP 08/26/2018 (Exact Date)   BMI 22.65 kg/m  General: NAD Pumonary: no increased work of breathing Abdomen: gravid, non-tender Extremities: no edema Psychiatric: mood appropriate, affect full  Assessment  28 y.o. G2P0010 at [redacted]w[redacted]d by  06/02/2019, by Last Menstrual Period presenting for routine prenatal visit  Plan   Problem List Items Addressed This Visit      Other   Supervision of low-risk first pregnancy   Relevant Orders   RPR+Rh+ABO+Rub Ab+Ab Scr+CB...    Other Visit Diagnoses    [redacted] weeks gestation of pregnancy    -  Primary      Pregnancy#2 Problems (from 08/26/18 to present)    Problem Noted Resolved   Supervision of low-risk first pregnancy 10/10/2018 by Will Bonnet, MD No   Asthma during pregnancy 10/10/2018 by Will Bonnet, MD No       Barnett Applebaum, MD, [place], [facility] 11/16/2018  4:30 PM

## 2018-11-17 LAB — RPR+RH+ABO+RUB AB+AB SCR+CB...
Antibody Screen: NEGATIVE
HIV Screen 4th Generation wRfx: NONREACTIVE
Hematocrit: 36.8 % (ref 34.0–46.6)
Hemoglobin: 13.2 g/dL (ref 11.1–15.9)
Hepatitis B Surface Ag: NEGATIVE
MCH: 32.8 pg (ref 26.6–33.0)
MCHC: 35.9 g/dL — ABNORMAL HIGH (ref 31.5–35.7)
MCV: 92 fL (ref 79–97)
Platelets: 262 10*3/uL (ref 150–450)
RBC: 4.02 x10E6/uL (ref 3.77–5.28)
RDW: 11.7 % (ref 11.7–15.4)
RPR Ser Ql: NONREACTIVE
Rh Factor: POSITIVE
Rubella Antibodies, IGG: <0.9 index — ABNORMAL LOW (ref >0.99)
Varicella zoster IgG: 1074 index (ref >165)
WBC: 12.2 10*3/uL — ABNORMAL HIGH (ref 3.4–10.8)

## 2018-12-14 ENCOUNTER — Other Ambulatory Visit: Payer: Self-pay

## 2018-12-14 ENCOUNTER — Ambulatory Visit (INDEPENDENT_AMBULATORY_CARE_PROVIDER_SITE_OTHER): Payer: Medicaid Other | Admitting: Certified Nurse Midwife

## 2018-12-14 VITALS — BP 100/60 | Wt 139.0 lb

## 2018-12-14 DIAGNOSIS — Z3401 Encounter for supervision of normal first pregnancy, first trimester: Secondary | ICD-10-CM

## 2018-12-14 DIAGNOSIS — Z3A15 15 weeks gestation of pregnancy: Secondary | ICD-10-CM

## 2018-12-14 DIAGNOSIS — Z363 Encounter for antenatal screening for malformations: Secondary | ICD-10-CM

## 2018-12-14 DIAGNOSIS — R109 Unspecified abdominal pain: Secondary | ICD-10-CM | POA: Diagnosis not present

## 2018-12-14 DIAGNOSIS — O26892 Other specified pregnancy related conditions, second trimester: Secondary | ICD-10-CM

## 2018-12-14 LAB — POCT URINALYSIS DIPSTICK
Bilirubin, UA: NEGATIVE
Blood, UA: NEGATIVE
Glucose, UA: NEGATIVE
Ketones, UA: NEGATIVE
Nitrite, UA: NEGATIVE
Protein, UA: POSITIVE — AB
Spec Grav, UA: 1.01 (ref 1.010–1.025)
Urobilinogen, UA: NEGATIVE E.U./dL — AB
pH, UA: 7 (ref 5.0–8.0)

## 2018-12-14 LAB — POCT URINALYSIS DIPSTICK OB: Glucose, UA: NEGATIVE

## 2018-12-14 NOTE — Progress Notes (Signed)
C/o a lot of heavy pressure yesterday, went home and took a nap, pressure went away but came back in the evening.  Today no pressure unless abd is pressed on. rj

## 2018-12-16 LAB — URINE CULTURE: Organism ID, Bacteria: NO GROWTH

## 2018-12-17 NOTE — Progress Notes (Signed)
ROB at 15wk5d: complains of lower abdominal discomfort yesterday. Resolved with rest. No vaginal bleeding or leakage of fluid. No dysuria or bad odor to urine. Urine dipstick with trace leukocytes and trace protein Urine culture sent FHTs WNL Declines genetic testing ROB and anatomy scan in 4 weeks.  Dalia Heading, CNM

## 2019-01-11 ENCOUNTER — Other Ambulatory Visit: Payer: Self-pay

## 2019-01-11 ENCOUNTER — Ambulatory Visit (INDEPENDENT_AMBULATORY_CARE_PROVIDER_SITE_OTHER): Payer: Medicaid Other | Admitting: Certified Nurse Midwife

## 2019-01-11 ENCOUNTER — Ambulatory Visit (INDEPENDENT_AMBULATORY_CARE_PROVIDER_SITE_OTHER): Payer: Medicaid Other

## 2019-01-11 VITALS — BP 114/70 | Wt 141.0 lb

## 2019-01-11 DIAGNOSIS — Z363 Encounter for antenatal screening for malformations: Secondary | ICD-10-CM

## 2019-01-11 DIAGNOSIS — Z3401 Encounter for supervision of normal first pregnancy, first trimester: Secondary | ICD-10-CM

## 2019-01-11 DIAGNOSIS — Z3A19 19 weeks gestation of pregnancy: Secondary | ICD-10-CM

## 2019-01-11 LAB — POCT URINALYSIS DIPSTICK OB
Glucose, UA: NEGATIVE
POC,PROTEIN,UA: NEGATIVE

## 2019-01-11 NOTE — Progress Notes (Signed)
ROB at 19wk5d: Anatomy scan today, no vb. No lof. Feeling FM CGA 19wk 6 days, normal anatomy, female fetus, anterior placenta. Given Ready, Set, Baby information. ROB in 4 weeks  Dalia Heading, CNM

## 2019-01-24 ENCOUNTER — Other Ambulatory Visit: Payer: Self-pay

## 2019-01-24 ENCOUNTER — Telehealth: Payer: Self-pay

## 2019-01-24 ENCOUNTER — Ambulatory Visit (INDEPENDENT_AMBULATORY_CARE_PROVIDER_SITE_OTHER): Payer: Medicaid Other | Admitting: Obstetrics and Gynecology

## 2019-01-24 VITALS — BP 122/74 | Wt 145.0 lb

## 2019-01-24 DIAGNOSIS — O26892 Other specified pregnancy related conditions, second trimester: Secondary | ICD-10-CM

## 2019-01-24 DIAGNOSIS — R102 Pelvic and perineal pain unspecified side: Secondary | ICD-10-CM

## 2019-01-24 DIAGNOSIS — Z3402 Encounter for supervision of normal first pregnancy, second trimester: Secondary | ICD-10-CM

## 2019-01-24 DIAGNOSIS — O2342 Unspecified infection of urinary tract in pregnancy, second trimester: Secondary | ICD-10-CM

## 2019-01-24 DIAGNOSIS — J45909 Unspecified asthma, uncomplicated: Secondary | ICD-10-CM

## 2019-01-24 DIAGNOSIS — O99512 Diseases of the respiratory system complicating pregnancy, second trimester: Secondary | ICD-10-CM

## 2019-01-24 DIAGNOSIS — O26899 Other specified pregnancy related conditions, unspecified trimester: Secondary | ICD-10-CM

## 2019-01-24 DIAGNOSIS — Z3A21 21 weeks gestation of pregnancy: Secondary | ICD-10-CM

## 2019-01-24 LAB — POCT URINALYSIS DIPSTICK
Bilirubin, UA: NEGATIVE
Blood, UA: NEGATIVE
Glucose, UA: NEGATIVE
Ketones, UA: NEGATIVE
Nitrite, UA: NEGATIVE
Protein, UA: NEGATIVE
Spec Grav, UA: 1.02 (ref 1.010–1.025)
Urobilinogen, UA: NEGATIVE E.U./dL — AB
pH, UA: 7 (ref 5.0–8.0)

## 2019-01-24 MED ORDER — NITROFURANTOIN MONOHYD MACRO 100 MG PO CAPS: 100.0000 mg | ORAL_CAPSULE | Freq: Two times a day (BID) | ORAL | 0 refills | Status: DC

## 2019-01-24 NOTE — Telephone Encounter (Signed)
Pt calling c/o having a lot of constant pressure all day; left work b/c of it; unable to get relief.  [phone]  Pt states pressure is not as bad today but does still has it and had some sharp pain with it this morning.  Tx'd to SP to schedule appt.

## 2019-01-24 NOTE — Progress Notes (Signed)
Routine Prenatal Care Visit  Subjective  Tina Fischer is a 28 y.o. G2P0010 at [redacted]w[redacted]d being seen today for ongoing prenatal care.  She is currently monitored for the following issues for this low-risk pregnancy and has Migraines; Mild intermittent asthma without complication; Allergic rhinitis due to allergen; ADHD; Lipoma; Supervision of low-risk first pregnancy; and Asthma during pregnancy on their problem list.  ----------------------------------------------------------------------------------- Patient reports recently worsening lower abdominal pressure (pelvic pressure).  The pain has been more pronounced the last couple of days.  She denies dysuria, hematuria. She denies vaginal irritative symptoms of itching, burning, irritation, and change in her discharge. She denies bowel issues. She denies back pain and fever.     Contractions: Not present. Vag. Bleeding: None.  Movement: Present. Leaking Fluid denies.  ----------------------------------------------------------------------------------- The following portions of the patient's history were reviewed and updated as appropriate: allergies, current medications, past family history, past medical history, past social history, past surgical history and problem list. Problem list updated.  Objective  Blood pressure 122/74, weight 145 lb (65.8 kg), last menstrual period 08/26/2018. Pregravid weight 125 lb (56.7 kg) Total Weight Gain 20 lb (9.072 kg) Urinalysis: Urine Protein    Urine Glucose    Fetal Status: Fetal Heart Rate (bpm): 145   Movement: Present     General:  Alert, oriented and cooperative. Patient is in no acute distress.  Skin: Skin is warm and dry. No rash noted.   Cardiovascular: Normal heart rate noted  Respiratory: Normal respiratory effort, no problems with respiration noted  Abdomen: Soft, gravid, appropriate for gestational age. Pain/Pressure: Present     Pelvic:  Normal EFG, normal BUS, cervix is visually closed, no  abnormal discharge  Extremities: Normal range of motion.     Mental Status: Normal mood and affect. Normal behavior. Normal judgment and thought content.   Wet Prep: PH: 4.5 Clue Cells: Negative Fungal elements: Negative Trichomonas: Negative  UA: +LE  Assessment   28 y.o. G2P0010 at [redacted]w[redacted]d by  06/02/2019, by Last Menstrual Period presenting for work-in prenatal visit. She appears to have a UTI.  Plan   Pregnancy#2 Problems (from 08/26/18 to present)    Problem Noted Resolved   Supervision of low-risk first pregnancy 10/10/2018 by Will Bonnet, MD No   Overview Addendum 01/11/2019  5:03 PM by Dalia Heading, CNM    [facility]inic [facility] Prenatal Labs  Dating [place] Blood type: A/Positive/-- (08/14 1641)   Genetic Screen declines Antibody:Negative (08/14 1641)  Anatomic [place] Normal anatomy, female, anterior placenta Rubella: <0.90 (08/14 1641) Varicella: @VZVIGG @  GTT Third trimester:  RPR: Non Reactive (08/14 1641)   Rhogam n/a HBsAg: Negative (08/14 1641)   TDaP vaccine planned  Flu Shot:planned HIV: Non Reactive (08/14 1641)   Baby Food           Breast                     GBS:   Contraception  Pap:2018; plan PP  CBB  No   CS/VBAC n/a   Support Person          Asthma during pregnancy 10/10/2018 by Will Bonnet, MD No       Preterm labor symptoms and general obstetric precautions including but not limited to vaginal bleeding, contractions, leaking of fluid and fetal movement were reviewed in detail with the patient. Please refer to After Visit Summary for other counseling recommendations.   - Tx for UTI with macrobid. - urine culture and sensitivities - no  evidence of labor or cervical dilation. No evidence of vaginitis. - No evidence of pyelonephritis. Precautions given.  Return for Keep previously schedule prenatal appointment.  Prentice Docker, MD, [facility], [facility] 01/24/2019 11:23 AM

## 2019-02-06 ENCOUNTER — Encounter: Payer: Medicaid Other | Admitting: Advanced Practice Midwife

## 2019-02-08 ENCOUNTER — Encounter: Payer: Medicaid Other | Admitting: Obstetrics and Gynecology

## 2019-02-20 ENCOUNTER — Other Ambulatory Visit: Payer: Self-pay

## 2019-02-20 ENCOUNTER — Ambulatory Visit (INDEPENDENT_AMBULATORY_CARE_PROVIDER_SITE_OTHER): Payer: Medicaid Other | Admitting: Maternal Newborn

## 2019-02-20 ENCOUNTER — Encounter: Payer: Self-pay | Admitting: Maternal Newborn

## 2019-02-20 VITALS — BP 100/60 | Wt 151.0 lb

## 2019-02-20 DIAGNOSIS — Z3A25 25 weeks gestation of pregnancy: Secondary | ICD-10-CM

## 2019-02-20 DIAGNOSIS — Z3402 Encounter for supervision of normal first pregnancy, second trimester: Secondary | ICD-10-CM

## 2019-02-20 LAB — POCT URINALYSIS DIPSTICK OB
Glucose, UA: NEGATIVE
POC,PROTEIN,UA: NEGATIVE

## 2019-02-20 NOTE — Patient Instructions (Signed)

## 2019-02-20 NOTE — Progress Notes (Signed)
    Routine Prenatal Care Visit  Subjective  Tina Fischer is a 28 y.o. G2P0010 at [redacted]w[redacted]d being seen today for ongoing prenatal care.  She is currently monitored for the following issues for this low-risk pregnancy and has Migraines; Mild intermittent asthma without complication; Allergic rhinitis due to allergen; ADHD; Lipoma; Supervision of low-risk first pregnancy; and Asthma during pregnancy on their problem list.  ----------------------------------------------------------------------------------- Patient reports heartburn. She has been taking chewable antacids with relief. Contractions: Not present. Vag. Bleeding: None.  Movement: Present. No leaking of fluid.  ----------------------------------------------------------------------------------- The following portions of the patient's history were reviewed and updated as appropriate: allergies, current medications, past family history, past medical history, past social history, past surgical history and problem list. Problem list updated.   Objective  Blood pressure 100/60, weight 151 lb (68.5 kg), last menstrual period 08/26/2018. Pregravid weight 125 lb (56.7 kg) Total Weight Gain 26 lb (11.8 kg) Urinalysis: Urine dipstick shows negative for glucose, protein. Fetal Status: Fetal Heart Rate (bpm): 140 Fundal Height: 24 cm Movement: Present     General:  Alert, oriented and cooperative. Patient is in no acute distress.  Skin: Skin is warm and dry. No rash noted.   Cardiovascular: Normal heart rate noted  Respiratory: Normal respiratory effort, no problems with respiration noted  Abdomen: Soft, gravid, appropriate for gestational age. Pain/Pressure: Absent     Pelvic:  Cervical exam deferred        Extremities: Normal range of motion.     Mental Status: Normal mood and affect. Normal behavior. Normal judgment and thought content.     Assessment   28 y.o. G2P0010 at [redacted]w[redacted]d EDD 06/02/2019, by Last Menstrual Period presenting for a  routine prenatal visit.  Plan   Pregnancy#2 Problems (from 08/26/18 to present)    Problem Noted Resolved   Supervision of low-risk first pregnancy 10/10/2018 by Will Bonnet, MD No   Overview Addendum 01/11/2019  5:03 PM by Dalia Heading, CNM    [facility]inic [facility] Prenatal Labs  Dating [place] Blood type: A/Positive/-- (08/14 1641)   Genetic Screen declines Antibody:Negative (08/14 1641)  Anatomic [place] Normal anatomy, female, anterior placenta Rubella: <0.90 (08/14 1641) Varicella: @VZVIGG @  GTT Third trimester:  RPR: Non Reactive (08/14 1641)   Rhogam n/a HBsAg: Negative (08/14 1641)   TDaP vaccine planned  Flu Shot:planned HIV: Non Reactive (08/14 1641)   Baby Food           Breast                     GBS:   Contraception  Pap:2018; plan PP  CBB  No   CS/VBAC n/a   Support Person            Asthma during pregnancy 10/10/2018 by Will Bonnet, MD No      Please refer to After Visit Summary for other counseling recommendations.   Return in about 3 weeks (around 03/13/2019) for ROB and GTT/labs.  Avel Sensor, CNM 02/20/2019  11:22 AM

## 2019-02-20 NOTE — Progress Notes (Signed)
No concerns.rj 

## 2019-02-25 ENCOUNTER — Other Ambulatory Visit: Payer: Self-pay | Admitting: *Deleted

## 2019-02-25 ENCOUNTER — Telehealth: Payer: Self-pay

## 2019-02-25 ENCOUNTER — Ambulatory Visit: Payer: Self-pay | Admitting: Primary Care

## 2019-02-25 DIAGNOSIS — Z20828 Contact with and (suspected) exposure to other viral communicable diseases: Secondary | ICD-10-CM | POA: Diagnosis not present

## 2019-02-25 DIAGNOSIS — Z20822 Contact with and (suspected) exposure to covid-19: Secondary | ICD-10-CM

## 2019-02-25 NOTE — Telephone Encounter (Signed)
We dont, and there is no rapid test except for inpatients in hospital.  She can be tested on her own though at [facility] area without a doctors order if she desires to know and plan.

## 2019-02-25 NOTE — Telephone Encounter (Signed)
Pt calling triage line stating over the weekend She started getting allergy symptoms. Today she started having a cough. No fever. She is [redacted] weeks gestation. She does have a nephew coming in from the [employer] for Thanksgiving. Do we recommend her getting tested for COVID? If so, can it be a rapid test?

## 2019-02-25 NOTE — Telephone Encounter (Signed)
Pt aware.

## 2019-02-27 LAB — NOVEL CORONAVIRUS, NAA: SARS-CoV-2, NAA: NOT DETECTED

## 2019-03-13 ENCOUNTER — Encounter: Payer: Self-pay | Admitting: Obstetrics and Gynecology

## 2019-03-13 ENCOUNTER — Ambulatory Visit (INDEPENDENT_AMBULATORY_CARE_PROVIDER_SITE_OTHER): Payer: Medicaid Other | Admitting: Obstetrics and Gynecology

## 2019-03-13 ENCOUNTER — Other Ambulatory Visit: Payer: Medicaid Other

## 2019-03-13 ENCOUNTER — Other Ambulatory Visit: Payer: Self-pay

## 2019-03-13 VITALS — BP 100/60 | Wt 158.0 lb

## 2019-03-13 DIAGNOSIS — Z3402 Encounter for supervision of normal first pregnancy, second trimester: Secondary | ICD-10-CM | POA: Diagnosis not present

## 2019-03-13 DIAGNOSIS — Z3A28 28 weeks gestation of pregnancy: Secondary | ICD-10-CM

## 2019-03-13 DIAGNOSIS — Z3483 Encounter for supervision of other normal pregnancy, third trimester: Secondary | ICD-10-CM

## 2019-03-13 DIAGNOSIS — Z3403 Encounter for supervision of normal first pregnancy, third trimester: Secondary | ICD-10-CM

## 2019-03-13 NOTE — Progress Notes (Signed)
    Routine Prenatal Care Visit  Subjective  Tina Fischer is a 28 y.o. G2P0010 at [redacted]w[redacted]d being seen today for ongoing prenatal care.  She is currently monitored for the following issues for this low-risk pregnancy and has Migraines; Mild intermittent asthma without complication; Allergic rhinitis due to allergen; ADHD; Lipoma; Supervision of low-risk first pregnancy; and Asthma during pregnancy on their problem list.  ----------------------------------------------------------------------------------- Patient reports some heart burn, controlled with pepcid. Some nausea. Some gas or cramps. Fatique. Waking up 4 times at night to pee.  Contractions: Not present. Vag. Bleeding: None.  Movement: Present. Denies leaking of fluid.  ----------------------------------------------------------------------------------- The following portions of the patient's history were reviewed and updated as appropriate: allergies, current medications, past family history, past medical history, past social history, past surgical history and problem list. Problem list updated.   Objective  Blood pressure 100/60, weight 158 lb (71.7 kg), last menstrual period 08/26/2018. Pregravid weight 125 lb (56.7 kg) Total Weight Gain 33 lb (15 kg) Urinalysis:      Fetal Status: Fetal Heart Rate (bpm): 135 Fundal Height: 28 cm Movement: Present     General:  Alert, oriented and cooperative. Patient is in no acute distress.  Skin: Skin is warm and dry. No rash noted.   Cardiovascular: Normal heart rate noted  Respiratory: Normal respiratory effort, no problems with respiration noted  Abdomen: Soft, gravid, appropriate for gestational age. Pain/Pressure: Absent     Pelvic:  Cervical exam deferred        Extremities: Normal range of motion.     Mental Status: Normal mood and affect. Normal behavior. Normal judgment and thought content.     Assessment   28 y.o. G2P0010 at [redacted]w[redacted]d by  06/02/2019, by Last Menstrual Period  presenting for routine prenatal visit  Plan   Pregnancy#2 Problems (from 08/26/18 to present)    Problem Noted Resolved   Supervision of low-risk first pregnancy 10/10/2018 by Will Bonnet, MD No   Overview Addendum 03/13/2019  9:11 AM by Homero Fellers, MD    Clinic [facility] Prenatal Labs  Dating [place] Blood type: A/Positive/-- (08/14 1641)   Genetic Screen declines Antibody:Negative (08/14 1641)  Anatomic [place] Normal anatomy, female, anterior placenta Rubella: <0.90  nonimmune Varicella: Immune  GTT Third trimester:  RPR: Non Reactive (08/14 1641)   Rhogam n/a HBsAg: Negative (08/14 1641)   TDaP vaccine planned  Flu Shot:planned HIV: Non Reactive (08/14 1641)   Baby Food           Breast                     GBS:   Contraception  Pap:2018; plan PP  CBB  No   CS/VBAC n/a   Support Person            Asthma during pregnancy 10/10/2018 by Will Bonnet, MD No       Gestational age appropriate obstetric precautions including but not limited to vaginal bleeding, contractions, leaking of fluid and fetal movement were reviewed in detail with the patient.    Given information about [email or web address] Given booklet aobut breastfeeding Discussed [facility] common practices.  Return in about 2 weeks (around 03/27/2019) for ROB.  Homero Fellers MD [facility], [facility] 03/13/2019, 9:28 AM

## 2019-03-13 NOTE — Patient Instructions (Signed)
[email or web address]   Third Trimester of Pregnancy  The third trimester is from week 28 through week 40 (months 7 through 9). This trimester is when your unborn baby (fetus) is growing very fast. At the end of the ninth month, the unborn baby is about 20 inches in length. It weighs about 6-10 pounds. Follow these instructions at home: Medicines  Take over-the-counter and prescription medicines only as told by your doctor. Some medicines are safe and some medicines are not safe during pregnancy.  Take a prenatal vitamin that contains at least 600 micrograms (mcg) of folic acid.  If you have trouble pooping (constipation), take medicine that will make your stool soft (stool softener) if your doctor approves. Eating and drinking   Eat regular, healthy meals.  Avoid raw meat and uncooked cheese.  If you get low calcium from the food you eat, talk to your doctor about taking a daily calcium supplement.  Eat four or five small meals rather than three large meals a day.  Avoid foods that are high in fat and sugars, such as fried and sweet foods.  To prevent constipation: ? Eat foods that are high in fiber, like fresh fruits and vegetables, whole grains, and beans. ? Drink enough fluids to keep your pee (urine) clear or pale yellow. Activity  Exercise only as told by your doctor. Stop exercising if you start to have cramps.  Avoid heavy lifting, wear low heels, and sit up straight.  Do not exercise if it is too hot, too humid, or if you are in a place of great height (high altitude).  You may continue to have sex unless your doctor tells you not to. Relieving pain and discomfort  Wear a good support bra if your breasts are tender.  Take frequent breaks and rest with your legs raised if you have leg cramps or low back pain.  Take warm water baths (sitz baths) to soothe pain or discomfort caused by hemorrhoids. Use hemorrhoid cream if your doctor approves.  If you develop  puffy, bulging veins (varicose veins) in your legs: ? Wear support hose or compression stockings as told by your doctor. ? Raise (elevate) your feet for 15 minutes, 3-4 times a day. ? Limit salt in your food. Safety  Wear your seat belt when driving.  Make a list of emergency phone numbers, including numbers for family, friends, the hospital, and police and fire departments. Preparing for your baby's arrival To prepare for the arrival of your baby:  Take prenatal classes.  Practice driving to the hospital.  Visit the hospital and tour the maternity area.  Talk to your work about taking leave once the baby comes.  Pack your hospital bag.  Prepare the baby's room.  Go to your doctor visits.  Buy a rear-facing car seat. Learn how to install it in your car. General instructions  Do not use hot tubs, steam rooms, or saunas.  Do not use any products that contain nicotine or tobacco, such as cigarettes and e-cigarettes. If you need help quitting, ask your doctor.  Do not drink alcohol.  Do not douche or use tampons or scented sanitary pads.  Do not cross your legs for long periods of time.  Do not travel for long distances unless you must. Only do so if your doctor says it is okay.  Visit your dentist if you have not gone during your pregnancy. Use a soft toothbrush to brush your teeth. Be gentle when you floss.  Avoid cat litter  boxes and soil used by cats. These carry germs that can cause birth defects in the baby and can cause a loss of your baby (miscarriage) or stillbirth.  Keep all your prenatal visits as told by your doctor. This is important. Contact a doctor if:  You are not sure if you are in labor or if your water has broken.  You are dizzy.  You have mild cramps or pressure in your lower belly.  You have a nagging pain in your belly area.  You continue to feel sick to your stomach, you throw up, or you have watery poop.  You have bad smelling fluid  coming from your vagina.  You have pain when you pee. Get help right away if:  You have a fever.  You are leaking fluid from your vagina.  You are spotting or bleeding from your vagina.  You have severe belly cramps or pain.  You lose or gain weight quickly.  You have trouble catching your breath and have chest pain.  You notice sudden or extreme puffiness (swelling) of your face, hands, ankles, feet, or legs.  You have not felt the baby move in over an hour.  You have severe headaches that do not go away with medicine.  You have trouble seeing.  You are leaking, or you are having a gush of fluid, from your vagina before you are 37 weeks.  You have regular belly spasms (contractions) before you are 37 weeks. Summary  The third trimester is from week 28 through week 40 (months 7 through 9). This time is when your unborn baby is growing very fast.  Follow your doctor's advice about medicine, food, and activity.  Get ready for the arrival of your baby by taking prenatal classes, getting all the baby items ready, preparing the baby's room, and visiting your doctor to be checked.  Get help right away if you are bleeding from your vagina, or you have chest pain and trouble catching your breath, or if you have not felt your baby move in over an hour. This information is not intended to replace advice given to you by your health care provider. Make sure you discuss any questions you have with your health care provider. Document Released: 06/15/2009 Document Revised: 07/12/2018 Document Reviewed: 04/26/2016 Elsevier Patient Education  2020 [employer].

## 2019-03-13 NOTE — Progress Notes (Signed)
ROB/GTT C/o nausea, and heart burn  Denies lof, no vb Good FM

## 2019-03-14 LAB — 28 WEEK RH+PANEL
Basophils Absolute: 0 10*3/uL (ref 0.0–0.2)
Basos: 0 %
EOS (ABSOLUTE): 0.2 10*3/uL (ref 0.0–0.4)
Eos: 1 %
Gestational Diabetes Screen: 78 mg/dL (ref 65–139)
HIV Screen 4th Generation wRfx: NONREACTIVE
Hematocrit: 35.8 % (ref 34.0–46.6)
Hemoglobin: 12.3 g/dL (ref 11.1–15.9)
Immature Grans (Abs): 0.1 10*3/uL (ref 0.0–0.1)
Immature Granulocytes: 1 %
Lymphocytes Absolute: 1.7 10*3/uL (ref 0.7–3.1)
Lymphs: 14 %
MCH: 32.7 pg (ref 26.6–33.0)
MCHC: 34.4 g/dL (ref 31.5–35.7)
MCV: 95 fL (ref 79–97)
Monocytes Absolute: 0.7 10*3/uL (ref 0.1–0.9)
Monocytes: 6 %
Neutrophils Absolute: 9.1 10*3/uL — ABNORMAL HIGH (ref 1.4–7.0)
Neutrophils: 78 %
Platelets: 256 10*3/uL (ref 150–450)
RBC: 3.76 x10E6/uL — ABNORMAL LOW (ref 3.77–5.28)
RDW: 11.8 % (ref 11.7–15.4)
RPR Ser Ql: NONREACTIVE
WBC: 11.7 10*3/uL — ABNORMAL HIGH (ref 3.4–10.8)

## 2019-03-27 ENCOUNTER — Encounter: Payer: Self-pay | Admitting: Advanced Practice Midwife

## 2019-03-27 ENCOUNTER — Ambulatory Visit (INDEPENDENT_AMBULATORY_CARE_PROVIDER_SITE_OTHER): Payer: Medicaid Other | Admitting: Advanced Practice Midwife

## 2019-03-27 ENCOUNTER — Other Ambulatory Visit: Payer: Self-pay

## 2019-03-27 VITALS — BP 110/70 | Wt 159.0 lb

## 2019-03-27 DIAGNOSIS — Z3A30 30 weeks gestation of pregnancy: Secondary | ICD-10-CM

## 2019-03-27 NOTE — Progress Notes (Signed)
No vb. No lof. Declines TDAP

## 2019-03-27 NOTE — Progress Notes (Signed)
  Routine Prenatal Care Visit  Subjective  Tina Fischer is a 28 y.o. G2P0010 at [redacted]w[redacted]d being seen today for ongoing prenatal care.  She is currently monitored for the following issues for this low-risk pregnancy and has Migraines; Mild intermittent asthma without complication; Allergic rhinitis due to allergen; ADHD; Lipoma; Supervision of low-risk first pregnancy; and Asthma during pregnancy on their problem list.  ----------------------------------------------------------------------------------- Patient reports feeling well and has good fetal movement.   Contractions: Not present. Vag. Bleeding: None.  Movement: Present. Leaking Fluid denies.  ----------------------------------------------------------------------------------- The following portions of the patient's history were reviewed and updated as appropriate: allergies, current medications, past family history, past medical history, past social history, past surgical history and problem list. Problem list updated.  Objective  Blood pressure 110/70, weight 159 lb (72.1 kg), last menstrual period 08/26/2018. Pregravid weight 125 lb (56.7 kg) Total Weight Gain 34 lb (15.4 kg) Urinalysis: Urine Protein    Urine Glucose    Fetal Status: Fetal Heart Rate (bpm): 146 Fundal Height: 30 cm Movement: Present     General:  Alert, oriented and cooperative. Patient is in no acute distress.  Skin: Skin is warm and dry. No rash noted.   Cardiovascular: Normal heart rate noted  Respiratory: Normal respiratory effort, no problems with respiration noted  Abdomen: Soft, gravid, appropriate for gestational age. Pain/Pressure: Present     Pelvic:  Cervical exam deferred        Extremities: Normal range of motion.     Mental Status: Normal mood and affect. Normal behavior. Normal judgment and thought content.   Assessment   28 y.o. G2P0010 at [redacted]w[redacted]d by  06/02/2019, by Last Menstrual Period presenting for routine prenatal visit  Plan   Pregnancy#2  Problems (from 08/26/18 to present)    Problem Noted Resolved   Supervision of low-risk first pregnancy 10/10/2018 by Will Bonnet, MD No   Overview Addendum 03/13/2019  9:11 AM by Homero Fellers, MD    [facility]inic [facility] Prenatal Labs  Dating [place] Blood type: A/Positive/-- (08/14 1641)   Genetic Screen declines Antibody:Negative (08/14 1641)  Anatomic [place] Normal anatomy, female, anterior placenta Rubella: <0.90  nonimmune Varicella: Immune  GTT Third trimester:  RPR: Non Reactive (08/14 1641)   Rhogam n/a HBsAg: Negative (08/14 1641)   TDaP vaccine planned  Flu Shot:planned HIV: Non Reactive (08/14 1641)   Baby Food           Breast                     GBS:   Contraception  Pap:2018; plan PP  CBB  No   CS/VBAC n/a   Support Person            Asthma during pregnancy 10/10/2018 by Will Bonnet, MD No       Preterm labor symptoms and general obstetric precautions including but not limited to vaginal bleeding, contractions, leaking of fluid and fetal movement were reviewed in detail with the patient.   Return in about 2 weeks (around 04/10/2019) for rob.  Rod Can, CNM 03/27/2019 9:11 AM

## 2019-04-05 NOTE — L&D Delivery Note (Signed)
Delivery Note At 6:22 PM a viable female was delivered via Vaginal, Spontaneous (Presentation: Left Occiput Anterior).  APGAR: 9, 9; weight pending.   Placenta status: Spontaneous, Intact.  Cord: 3 vessels with the following complications: None.  Cord pH: n/a  Anesthesia: Epidural Episiotomy: None Lacerations: 2nd degree;Vaginal Suture Repair: 3.0 vicryl Est. Blood Loss (mL):  250 (QBL pending)  Mom to postpartum.  Baby to Couplet care / Skin to Skin.  Called to see patient.  Mom pushed to deliver a viable female infant.  The head followed by shoulders, which delivered without difficulty, and the rest of the body.  No nuchal cord noted.  Baby to mom's chest.  Cord clamped and cut after > 1 min delay.  No cord blood obtained.  Placenta delivered spontaneously, intact, with a 3-vessel cord.  Second degree vaginal laceration repaired with 3-0 Vicryl in standard fashion.  All counts correct.  Hemostasis obtained with IV pitocin and fundal massage. EBL 250 mL.     Prentice Docker 05/28/2019, 6:52 PM

## 2019-04-10 ENCOUNTER — Ambulatory Visit (INDEPENDENT_AMBULATORY_CARE_PROVIDER_SITE_OTHER): Payer: Medicaid Other | Admitting: Obstetrics and Gynecology

## 2019-04-10 ENCOUNTER — Other Ambulatory Visit: Payer: Self-pay

## 2019-04-10 VITALS — BP 122/67 | Wt 163.0 lb

## 2019-04-10 DIAGNOSIS — Z3403 Encounter for supervision of normal first pregnancy, third trimester: Secondary | ICD-10-CM

## 2019-04-10 DIAGNOSIS — O99513 Diseases of the respiratory system complicating pregnancy, third trimester: Secondary | ICD-10-CM

## 2019-04-10 DIAGNOSIS — Z3A32 32 weeks gestation of pregnancy: Secondary | ICD-10-CM

## 2019-04-10 DIAGNOSIS — Z23 Encounter for immunization: Secondary | ICD-10-CM

## 2019-04-10 DIAGNOSIS — J45909 Unspecified asthma, uncomplicated: Secondary | ICD-10-CM

## 2019-04-10 NOTE — Progress Notes (Signed)
ROB/Tdap/BT consent today- no concerns

## 2019-04-10 NOTE — Progress Notes (Signed)
    Routine Prenatal Care Visit  Subjective  Tina Fischer is a 29 y.o. G2P0010 at [redacted]w[redacted]d being seen today for ongoing prenatal care.  She is currently monitored for the following issues for this high-risk pregnancy and has Migraines; Mild intermittent asthma without complication; Allergic rhinitis due to allergen; ADHD; Lipoma; Supervision of low-risk first pregnancy; and Asthma during pregnancy on their problem list.  ----------------------------------------------------------------------------------- Patient reports no complaints.   Contractions: Not present. Vag. Bleeding: None.  Movement: Present. Denies leaking of fluid.  ----------------------------------------------------------------------------------- The following portions of the patient's history were reviewed and updated as appropriate: allergies, current medications, past family history, past medical history, past social history, past surgical history and problem list. Problem list updated.   Objective  Blood pressure 122/67, weight 163 lb (73.9 kg), last menstrual period 08/26/2018. Pregravid weight 125 lb (56.7 kg) Total Weight Gain 38 lb (17.2 kg) Urinalysis:      Fetal Status: Fetal Heart Rate (bpm): 140   Movement: Present  Presentation: Vertex  General:  Alert, oriented and cooperative. Patient is in no acute distress.  Skin: Skin is warm and dry. No rash noted.   Cardiovascular: Normal heart rate noted  Respiratory: Normal respiratory effort, no problems with respiration noted  Abdomen: Soft, gravid, appropriate for gestational age. Pain/Pressure: Present     Pelvic:  Cervical exam deferred        Extremities: Normal range of motion.     ental Status: Normal mood and affect. Normal behavior. Normal judgment and thought content.     Assessment   29 y.o. G2P0010 at [redacted]w[redacted]d by  06/02/2019, by Last Menstrual Period presenting for routine prenatal visit  Plan   Pregnancy#2 Problems (from 08/26/18 to present)    Problem Noted Resolved   Supervision of low-risk first pregnancy 10/10/2018 by Will Bonnet, MD No   Overview Addendum 03/13/2019  9:11 AM by Homero Fellers, MD    [facility]inic [facility] Prenatal Labs  Dating [place] Blood type: A/Positive/-- (08/14 1641)   Genetic Screen declines Antibody:Negative (08/14 1641)  Anatomic [place] Normal anatomy, female, anterior placenta Rubella: <0.90  nonimmune Varicella: Immune  GTT Third trimester:  RPR: Non Reactive (08/14 1641)   Rhogam n/a HBsAg: Negative (08/14 1641)   TDaP vaccine planned  Flu Shot:planned HIV: Non Reactive (08/14 1641)   Baby Food           Breast                     GBS:   Contraception  Pap:2018; plan PP  CBB  No   CS/VBAC n/a   Support Person            Asthma during pregnancy 10/10/2018 by Will Bonnet, MD No       Gestational age appropriate obstetric precautions including but not limited to vaginal bleeding, contractions, leaking of fluid and fetal movement were reviewed in detail with the patient.    Return in about 2 weeks (around 04/24/2019) for ROB.  Malachy Mood, MD, [facility], [facility] 04/10/2019, 4:32 PM

## 2019-04-20 ENCOUNTER — Other Ambulatory Visit: Payer: Self-pay | Admitting: Primary Care

## 2019-04-20 DIAGNOSIS — J3089 Other allergic rhinitis: Secondary | ICD-10-CM

## 2019-04-20 DIAGNOSIS — J4520 Mild intermittent asthma, uncomplicated: Secondary | ICD-10-CM

## 2019-04-25 ENCOUNTER — Ambulatory Visit (INDEPENDENT_AMBULATORY_CARE_PROVIDER_SITE_OTHER): Payer: Medicaid Other | Admitting: Obstetrics and Gynecology

## 2019-04-25 ENCOUNTER — Encounter: Payer: Self-pay | Admitting: Obstetrics and Gynecology

## 2019-04-25 ENCOUNTER — Other Ambulatory Visit: Payer: Self-pay

## 2019-04-25 VITALS — BP 122/66 | Wt 166.0 lb

## 2019-04-25 DIAGNOSIS — O99513 Diseases of the respiratory system complicating pregnancy, third trimester: Secondary | ICD-10-CM

## 2019-04-25 DIAGNOSIS — Z3403 Encounter for supervision of normal first pregnancy, third trimester: Secondary | ICD-10-CM

## 2019-04-25 DIAGNOSIS — J4520 Mild intermittent asthma, uncomplicated: Secondary | ICD-10-CM

## 2019-04-25 DIAGNOSIS — Z3A34 34 weeks gestation of pregnancy: Secondary | ICD-10-CM

## 2019-04-25 LAB — POCT URINALYSIS DIPSTICK OB
Glucose, UA: NEGATIVE
POC,PROTEIN,UA: NEGATIVE

## 2019-04-25 NOTE — Progress Notes (Signed)
  Routine Prenatal Care Visit  Subjective  Tina Fischer is a 29 y.o. G2P0010 at [redacted]w[redacted]d being seen today for ongoing prenatal care.  She is currently monitored for the following issues for this low-risk pregnancy and has Migraines; Mild intermittent asthma without complication; Allergic rhinitis due to allergen; ADHD; Lipoma; Supervision of low-risk first pregnancy; and Asthma during pregnancy on their problem list.  ----------------------------------------------------------------------------------- Patient reports low back pain, cramping that is mild but consistent.   Contractions: Not present. Vag. Bleeding: None.  Movement: Present. Leaking Fluid denies.  ----------------------------------------------------------------------------------- The following portions of the patient's history were reviewed and updated as appropriate: allergies, current medications, past family history, past medical history, past social history, past surgical history and problem list. Problem list updated.  Objective  Blood pressure 122/66, weight 166 lb (75.3 kg), last menstrual period 08/26/2018. Pregravid weight 125 lb (56.7 kg) Total Weight Gain 41 lb (18.6 kg) Urinalysis: Urine Protein Negative  Urine Glucose Negative  Fetal Status: Fetal Heart Rate (bpm): 110 Fundal Height: 34 cm Movement: Present  Presentation: Vertex  General:  Alert, oriented and cooperative. Patient is in no acute distress.  Skin: Skin is warm and dry. No rash noted.   Cardiovascular: Normal heart rate noted  Respiratory: Normal respiratory effort, no problems with respiration noted  Abdomen: Soft, gravid, appropriate for gestational age. Pain/Pressure: Present     Pelvic:  Cervical exam deferred        Extremities: Normal range of motion.     Mental Status: Normal mood and affect. Normal behavior. Normal judgment and thought content.   Due to low-normal fetal heart rate, bedside BPP performed: Fluid: 2/2 Breathing: 2/2 Gross  movement: 2/2 Flexion/extension: 2/2 Total: 8/8 BPP score = very reassuring BPP and fetal status.  Assessment   29 y.o. G2P0010 at [redacted]w[redacted]d by  06/02/2019, by Last Menstrual Period presenting for routine prenatal visit  Plan   Pregnancy#2 Problems (from 08/26/18 to present)    Problem Noted Resolved   Supervision of low-risk first pregnancy 10/10/2018 by Will Bonnet, MD No   Overview Addendum 03/13/2019  9:11 AM by Homero Fellers, MD    [facility]inic [facility] Prenatal Labs  Dating [place] Blood type: A/Positive/-- (08/14 1641)   Genetic Screen declines Antibody:Negative (08/14 1641)  Anatomic [place] Normal anatomy, female, anterior placenta Rubella: <0.90  nonimmune Varicella: Immune  GTT Third trimester:  RPR: Non Reactive (08/14 1641)   Rhogam n/a HBsAg: Negative (08/14 1641)   TDaP vaccine planned  Flu Shot:planned HIV: Non Reactive (08/14 1641)   Baby Food           Breast                     GBS:   Contraception  Pap:2018; plan PP  CBB  No   CS/VBAC n/a   Support Person          Asthma during pregnancy 10/10/2018 by Will Bonnet, MD No       Preterm labor symptoms and general obstetric precautions including but not limited to vaginal bleeding, contractions, leaking of fluid and fetal movement were reviewed in detail with the patient. Please refer to After Visit Summary for other counseling recommendations.   Return in about 1 week (around 05/02/2019) for Routine Prenatal Appointment (1-2 weeks).  Prentice Docker, MD, [facility], [facility] 04/25/2019 11:40 AM

## 2019-04-30 ENCOUNTER — Encounter: Payer: Self-pay | Admitting: Primary Care

## 2019-04-30 ENCOUNTER — Ambulatory Visit (INDEPENDENT_AMBULATORY_CARE_PROVIDER_SITE_OTHER): Payer: Medicaid Other | Admitting: Primary Care

## 2019-04-30 ENCOUNTER — Other Ambulatory Visit: Payer: Self-pay

## 2019-04-30 DIAGNOSIS — O99519 Diseases of the respiratory system complicating pregnancy, unspecified trimester: Secondary | ICD-10-CM

## 2019-04-30 DIAGNOSIS — J45909 Unspecified asthma, uncomplicated: Secondary | ICD-10-CM

## 2019-04-30 DIAGNOSIS — G43701 Chronic migraine without aura, not intractable, with status migrainosus: Secondary | ICD-10-CM

## 2019-04-30 DIAGNOSIS — J3089 Other allergic rhinitis: Secondary | ICD-10-CM

## 2019-04-30 DIAGNOSIS — J4520 Mild intermittent asthma, uncomplicated: Secondary | ICD-10-CM | POA: Diagnosis not present

## 2019-04-30 MED ORDER — MONTELUKAST SODIUM 10 MG PO TABS: 10.0000 mg | ORAL_TABLET | Freq: Every day | ORAL | 3 refills | Status: DC

## 2019-04-30 MED ORDER — ALBUTEROL SULFATE HFA 108 (90 BASE) MCG/ACT IN AERS: 2.0000 | INHALATION_SPRAY | Freq: Four times a day (QID) | RESPIRATORY_TRACT | 0 refills | Status: DC | PRN

## 2019-04-30 NOTE — Progress Notes (Signed)
Subjective:    Patient ID: Tina Fischer, female    DOB: 1991-01-06, 29 y.o.   MRN: [id number]  HPI  This visit occurred during the SARS-CoV-2 public health emergency.  Safety protocols were in place, including screening questions prior to the visit, additional usage of staff PPE, and extensive cleaning of exam room while observing appropriate contact time as indicated for disinfecting solutions.   [name] is a 29 year old female who presents today for follow up and medication refill.  1) Asthma/Allergic Rhinitis: Currently managed on albuterol HFA PRN, montelukast 10 mg, certirizine 10 mg. She is doing well on this regimen. She's using her albuterol inhaler infrequently overall. She stopped smoking in March 2020.  2) Migraines: Previously managed on sumatriptan 50 mg PRN. Since having her ear piercing two years ago she's had no migraines.   BP Readings from Last 3 Encounters:  04/30/19 104/74  04/25/19 122/66  04/10/19 122/67     Review of Systems  HENT: Positive for congestion. Negative for postnasal drip.   Respiratory: Negative for shortness of breath and wheezing.   Allergic/Immunologic: Positive for environmental allergies.       Past Medical History:  Diagnosis Date  . Allergy   . Asthma   . Frequent headaches   . Lipoma   . Migraines      Social History   Socioeconomic History  . Marital status: Married    Spouse name: Not on file  . Number of children: Not on file  . Years of education: Not on file  . Highest education level: Not on file  Occupational History  . Occupation: Unemployed  . Occupation: Airline pilot  Tobacco Use  . Smoking status: Former Smoker    Types: Cigarettes    Quit date: 05/23/2018    Years since quitting: 0.9  . Smokeless tobacco: Never Used  Substance and Sexual Activity  . Alcohol use: Yes    Comment: Occ  . Drug use: Yes    Types: Marijuana    Comment: Stopped with current pregnancy 2020  . Sexual  activity: Yes    Birth control/protection: None  Other Topics Concern  . Not on file  Social History Narrative   Married.   No children.   Works for [employer], yoga, Chiropodist.    Social Determinants of Health   Financial Resource Strain:   . Difficulty of Paying Living Expenses: Not on file  Food Insecurity:   . Worried About Charity fundraiser in the Last Year: Not on file  . Ran Out of Food in the Last Year: Not on file  Transportation Needs:   . Lack of Transportation (Medical): Not on file  . Lack of Transportation (Non-Medical): Not on file  Physical Activity:   . Days of Exercise per Week: Not on file  . Minutes of Exercise per Session: Not on file  Stress:   . Feeling of Stress : Not on file  Social Connections:   . Frequency of Communication with Friends and Family: Not on file  . Frequency of Social Gatherings with Friends and Family: Not on file  . Attends Religious Services: Not on file  . Active Member of Clubs or Organizations: Not on file  . Attends Archivist Meetings: Not on file  . Marital Status: Not on file  Intimate Partner Violence:   . Fear of Current or Ex-Partner: Not on file  . Emotionally Abused: Not on file  .  Physically Abused: Not on file  . Sexually Abused: Not on file    Past Surgical History:  Procedure Laterality Date  . TONSILLECTOMY AND ADENOIDECTOMY  2008  . WISDOM TOOTH EXTRACTION  2017    Family History  Problem Relation Age of Onset  . Cancer Maternal Grandmother 13       breast  . Early death Maternal Grandmother   . Early death Maternal Grandfather   . Depression Paternal Grandmother   . Depression Paternal Grandfather   . Cancer Paternal Grandfather 26       Prostated    No Known Allergies  Current Outpatient Medications on File Prior to Visit  Medication Sig Dispense Refill  . albuterol (PROVENTIL HFA;VENTOLIN HFA) 108 (90 Base) MCG/ACT inhaler Inhale 2 puffs into the lungs  every 4 (four) hours as needed for wheezing or shortness of breath. 1 Inhaler 0  . cetirizine (ZYRTEC) 10 MG tablet Take by mouth.    . famotidine-calcium carbonate-magnesium hydroxide (PEPCID COMPLETE) 10-800-165 MG chewable tablet Chew 1 tablet by mouth daily as needed.    . montelukast (SINGULAIR) 10 MG tablet TAKE 1 TABLET BY MOUTH AT BEDTIME FOR ALLERGIES AND ASTHMA. 90 tablet 1  . Prenat-FeFum-FePo-FA-Omega 3 (CONCEPT DHA) 53.5-38-1 MG CAPS Take 1 capsule by mouth daily.     No current facility-administered medications on file prior to visit.    BP 104/74   Pulse 84   Temp (!) 97.1 F (36.2 C) (Temporal)   Ht 5' 4.5" (1.638 m)   Wt 165 lb 8 oz (75.1 kg)   LMP 08/26/2018 (Exact Date)   SpO2 98%   BMI 27.97 kg/m    Objective:   Physical Exam  Constitutional: She appears well-nourished.  Cardiovascular: Normal rate and regular rhythm.  Respiratory: Effort normal and breath sounds normal.  Musculoskeletal:     Cervical back: Neck supple.  Skin: Skin is warm and dry.  Psychiatric: She has a normal mood and affect.           Assessment & Plan:

## 2019-04-30 NOTE — Assessment & Plan Note (Signed)
Doing well on current regimen, continue same. 

## 2019-04-30 NOTE — Assessment & Plan Note (Signed)
Resolved after ear piercing.  No recent migraine. Continue to monitor.

## 2019-04-30 NOTE — Assessment & Plan Note (Signed)
Stable. Doing well on Singulair, infrequent use of albuterol. Refills provided.

## 2019-04-30 NOTE — Assessment & Plan Note (Signed)
Doing well on prescribed regimen of Singulair, Zyrtec, and albuterol. Exam today stable. Refills sent to pharmacy.

## 2019-04-30 NOTE — Patient Instructions (Signed)
It was a pleasure to see you today!   

## 2019-05-08 ENCOUNTER — Other Ambulatory Visit: Payer: Self-pay

## 2019-05-08 ENCOUNTER — Ambulatory Visit (INDEPENDENT_AMBULATORY_CARE_PROVIDER_SITE_OTHER): Payer: Medicaid Other | Admitting: Obstetrics and Gynecology

## 2019-05-08 ENCOUNTER — Encounter: Payer: Self-pay | Admitting: Obstetrics and Gynecology

## 2019-05-08 ENCOUNTER — Other Ambulatory Visit (HOSPITAL_COMMUNITY)
Admission: RE | Admit: 2019-05-08 | Discharge: 2019-05-08 | Disposition: A | Discharge status: Home / Self Care | Payer: Medicaid Other | Source: Ambulatory Visit | Attending: Obstetrics and Gynecology | Admitting: Obstetrics and Gynecology

## 2019-05-08 VITALS — BP 115/67 | Wt 168.0 lb

## 2019-05-08 DIAGNOSIS — Z3A36 36 weeks gestation of pregnancy: Secondary | ICD-10-CM | POA: Insufficient documentation

## 2019-05-08 DIAGNOSIS — J45909 Unspecified asthma, uncomplicated: Secondary | ICD-10-CM

## 2019-05-08 DIAGNOSIS — J4520 Mild intermittent asthma, uncomplicated: Secondary | ICD-10-CM

## 2019-05-08 DIAGNOSIS — Z3403 Encounter for supervision of normal first pregnancy, third trimester: Secondary | ICD-10-CM | POA: Insufficient documentation

## 2019-05-08 DIAGNOSIS — O99513 Diseases of the respiratory system complicating pregnancy, third trimester: Secondary | ICD-10-CM

## 2019-05-08 DIAGNOSIS — O99519 Diseases of the respiratory system complicating pregnancy, unspecified trimester: Secondary | ICD-10-CM

## 2019-05-08 LAB — POCT URINALYSIS DIPSTICK OB
Glucose, UA: NEGATIVE
POC,PROTEIN,UA: NEGATIVE

## 2019-05-08 NOTE — Progress Notes (Signed)
  Routine Prenatal Care Visit  Subjective  Tina Fischer is a 29 y.o. G2P0010 at [redacted]w[redacted]d being seen today for ongoing prenatal care.  She is currently monitored for the following issues for this low-risk pregnancy and has Migraines; Mild intermittent asthma without complication; Allergic rhinitis due to allergen; ADHD; Lipoma; Supervision of low-risk first pregnancy; and Asthma during pregnancy on their problem list.  ----------------------------------------------------------------------------------- Patient reports no complaints.   Contractions: Not present. Vag. Bleeding: None.  Movement: Present. Leaking Fluid denies.  ----------------------------------------------------------------------------------- The following portions of the patient's history were reviewed and updated as appropriate: allergies, current medications, past family history, past medical history, past social history, past surgical history and problem list. Problem list updated.  Objective  Blood pressure 115/67, weight 168 lb (76.2 kg), last menstrual period 08/26/2018. Pregravid weight 125 lb (56.7 kg) Total Weight Gain 43 lb (19.5 kg) Urinalysis: Urine Protein Negative  Urine Glucose Negative  Fetal Status: Fetal Heart Rate (bpm): 130 Fundal Height: 36 cm Movement: Present  Presentation: Vertex  General:  Alert, oriented and cooperative. Patient is in no acute distress.  Skin: Skin is warm and dry. No rash noted.   Cardiovascular: Normal heart rate noted  Respiratory: Normal respiratory effort, no problems with respiration noted  Abdomen: Soft, gravid, appropriate for gestational age. Pain/Pressure: Absent     Pelvic:  Cervical exam deferred        Extremities: Normal range of motion.     Mental Status: Normal mood and affect. Normal behavior. Normal judgment and thought content.   Assessment   29 y.o. G2P0010 at [redacted]w[redacted]d by  06/02/2019, by Last Menstrual Period presenting for routine prenatal visit  Plan    Pregnancy#2 Problems (from 08/26/18 to present)    Problem Noted Resolved   Supervision of low-risk first pregnancy 10/10/2018 by Will Bonnet, MD No   Overview Addendum 03/13/2019  9:11 AM by Homero Fellers, MD    [facility]inic [facility] Prenatal Labs  Dating [place] Blood type: A/Positive/-- (08/14 1641)   Genetic Screen declines Antibody:Negative (08/14 1641)  Anatomic [place] Normal anatomy, female, anterior placenta Rubella: <0.90  nonimmune Varicella: Immune  GTT Third trimester:  RPR: Non Reactive (08/14 1641)   Rhogam n/a HBsAg: Negative (08/14 1641)   TDaP vaccine planned  Flu Shot:planned HIV: Non Reactive (08/14 1641)   Baby Food           Breast                     GBS:   Contraception  Pap:2018; plan PP  CBB  No   CS/VBAC n/a   Support Person            Asthma during pregnancy 10/10/2018 by Will Bonnet, MD No       Preterm labor symptoms and general obstetric precautions including but not limited to vaginal bleeding, contractions, leaking of fluid and fetal movement were reviewed in detail with the patient. Please refer to After Visit Summary for other counseling recommendations.   GBS/aptima today BSUS = cephalic  Return in about 1 week (around 05/15/2019) for Routine Prenatal Appointment.  Tina Docker, MD, [facility], [facility] 05/08/2019 2:22 PM

## 2019-05-10 LAB — CERVICOVAGINAL ANCILLARY ONLY
Chlamydia: NEGATIVE
Comment: NEGATIVE
Comment: NORMAL
Neisseria Gonorrhea: NEGATIVE

## 2019-05-10 LAB — STREP GP B NAA: Strep Gp B NAA: NEGATIVE

## 2019-05-15 ENCOUNTER — Ambulatory Visit (INDEPENDENT_AMBULATORY_CARE_PROVIDER_SITE_OTHER): Payer: Medicaid Other | Admitting: Obstetrics and Gynecology

## 2019-05-15 ENCOUNTER — Other Ambulatory Visit: Payer: Self-pay

## 2019-05-15 ENCOUNTER — Encounter: Payer: Self-pay | Admitting: Obstetrics and Gynecology

## 2019-05-15 VITALS — BP 118/78 | Wt 168.0 lb

## 2019-05-15 DIAGNOSIS — Z3483 Encounter for supervision of other normal pregnancy, third trimester: Secondary | ICD-10-CM

## 2019-05-15 DIAGNOSIS — Z3403 Encounter for supervision of normal first pregnancy, third trimester: Secondary | ICD-10-CM

## 2019-05-15 DIAGNOSIS — O99519 Diseases of the respiratory system complicating pregnancy, unspecified trimester: Secondary | ICD-10-CM

## 2019-05-15 DIAGNOSIS — O99513 Diseases of the respiratory system complicating pregnancy, third trimester: Secondary | ICD-10-CM

## 2019-05-15 DIAGNOSIS — J45909 Unspecified asthma, uncomplicated: Secondary | ICD-10-CM

## 2019-05-15 DIAGNOSIS — Z3A37 37 weeks gestation of pregnancy: Secondary | ICD-10-CM

## 2019-05-15 NOTE — Progress Notes (Signed)
  Routine Prenatal Care Visit  Subjective  Tina Fischer is a 29 y.o. G2P0010 at [redacted]w[redacted]d being seen today for ongoing prenatal care.  She is currently monitored for the following issues for this low-risk pregnancy and has Migraines; Mild intermittent asthma without complication; Allergic rhinitis due to allergen; ADHD; Lipoma; Supervision of low-risk first pregnancy; and Asthma during pregnancy on their problem list.  ----------------------------------------------------------------------------------- Patient reports itching in a few lateral stretch mark areas. She has tried several topicals without relief.   Contractions: Not present. Vag. Bleeding: None.  Movement: Present. Leaking Fluid denies.  ----------------------------------------------------------------------------------- The following portions of the patient's history were reviewed and updated as appropriate: allergies, current medications, past family history, past medical history, past social history, past surgical history and problem list. Problem list updated.  Objective  Blood pressure 118/78, weight 168 lb (76.2 kg), last menstrual period 08/26/2018. Pregravid weight 125 lb (56.7 kg) Total Weight Gain 43 lb (19.5 kg) Urinalysis: Urine Protein    Urine Glucose    Fetal Status: Fetal Heart Rate (bpm): 135 Fundal Height: 37 cm Movement: Present     General:  Alert, oriented and cooperative. Patient is in no acute distress.  Skin: Skin is warm and dry. No rash noted.   Cardiovascular: Normal heart rate noted  Respiratory: Normal respiratory effort, no problems with respiration noted  Abdomen: Soft, gravid, appropriate for gestational age. Pain/Pressure: Absent     Pelvic:  Cervical exam deferred        Extremities: Normal range of motion.     Mental Status: Normal mood and affect. Normal behavior. Normal judgment and thought content.   Assessment   29 y.o. G2P0010 at [redacted]w[redacted]d by  06/02/2019, by Last Menstrual Period presenting  for routine prenatal visit  Plan   Pregnancy#2 Problems (from 08/26/18 to present)    Problem Noted Resolved   Supervision of low-risk first pregnancy 10/10/2018 by Will Bonnet, MD No   Overview Addendum 03/13/2019  9:11 AM by Homero Fellers, MD    [facility] Prenatal Labs  Dating [place] Blood type: A/Positive/-- (08/14 1641)   Genetic Screen declines Antibody:Negative (08/14 1641)  Anatomic [place] Normal anatomy, female, anterior placenta Rubella: <0.90  nonimmune Varicella: Immune  GTT Third trimester:  RPR: Non Reactive (08/14 1641)   Rhogam n/a HBsAg: Negative (08/14 1641)   TDaP vaccine planned  Flu Shot:planned HIV: Non Reactive (08/14 1641)   Baby Food           Breast                     GBS:   Contraception  Pap:2018; plan PP  CBB  No   CS/VBAC n/a   Support Person            Asthma during pregnancy 10/10/2018 by Will Bonnet, MD No       Term labor symptoms and general obstetric precautions including but not limited to vaginal bleeding, contractions, leaking of fluid and fetal movement were reviewed in detail with the patient. Please refer to After Visit Summary for other counseling recommendations.   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 Discussed at home remedies.  - Discussed IOL at 39 weeks and discussed ARRIVE trial.  She will consider what she would like to do.   Return in about 1 week (around 05/22/2019) for Routine Prenatal Appointment.  Prentice Docker, MD, [facility], [facility] 05/15/2019 2:07 PM

## 2019-05-22 ENCOUNTER — Ambulatory Visit (INDEPENDENT_AMBULATORY_CARE_PROVIDER_SITE_OTHER): Payer: Medicaid Other | Admitting: Advanced Practice Midwife

## 2019-05-22 ENCOUNTER — Encounter: Payer: Self-pay | Admitting: Advanced Practice Midwife

## 2019-05-22 ENCOUNTER — Other Ambulatory Visit: Payer: Self-pay

## 2019-05-22 VITALS — BP 141/76 | Wt 173.0 lb

## 2019-05-22 DIAGNOSIS — Z3A38 38 weeks gestation of pregnancy: Secondary | ICD-10-CM

## 2019-05-22 DIAGNOSIS — Z3483 Encounter for supervision of other normal pregnancy, third trimester: Secondary | ICD-10-CM

## 2019-05-22 LAB — POCT URINALYSIS DIPSTICK OB
Glucose, UA: NEGATIVE
POC,PROTEIN,UA: NEGATIVE

## 2019-05-22 NOTE — Progress Notes (Signed)
ROB CTX 

## 2019-05-22 NOTE — Progress Notes (Signed)
  Routine Prenatal Care Visit  Subjective  Tina Fischer is a 29 y.o. G2P0010 at [redacted]w[redacted]d being seen today for ongoing prenatal care.  She is currently monitored for the following issues for this low-risk pregnancy and has Migraines; Mild intermittent asthma without complication; Allergic rhinitis due to allergen; ADHD; Lipoma; Supervision of low-risk first pregnancy; and Asthma during pregnancy on their problem list.  ----------------------------------------------------------------------------------- Patient reports feeling tired and admits pelvic pressure. She denies headaches, visual changes or epigastric pain. She is requesting iol next week. We discussed risks and benefits of induction of labor.  Contractions: Not present. Vag. Bleeding: None.  Movement: Present. Leaking Fluid denies.  ----------------------------------------------------------------------------------- The following portions of the patient's history were reviewed and updated as appropriate: allergies, current medications, past family history, past medical history, past social history, past surgical history and problem list. Problem list updated.  Objective  Blood pressure (!) 141/76, weight 173 lb (78.5 kg), last menstrual period 08/26/2018. Pregravid weight 125 lb (56.7 kg) Total Weight Gain 48 lb (21.8 kg) Urinalysis: Urine Protein    Urine Glucose     BP recheck: 130/80  Fetal Status: Fetal Heart Rate (bpm): 129 Fundal Height: 37 cm Movement: Present     General:  Alert, oriented and cooperative. Patient is in no acute distress.  Skin: Skin is warm and dry. No rash noted.   Cardiovascular: Normal heart rate noted  Respiratory: Normal respiratory effort, no problems with respiration noted  Abdomen: Soft, gravid, appropriate for gestational age. Pain/Pressure: Present     Pelvic:  Cervical exam performed Dilation: Closed      Extremities: Normal range of motion.     Mental Status: Normal mood and affect. Normal  behavior. Normal judgment and thought content.   Assessment   29 y.o. G2P0010 at [redacted]w[redacted]d by  06/02/2019, by Last Menstrual Period presenting for routine prenatal visit  Plan   Pregnancy#2 Problems (from 08/26/18 to present)    Problem Noted Resolved   Supervision of low-risk first pregnancy 10/10/2018 by Will Bonnet, MD No   Overview Addendum 03/13/2019  9:11 AM by Homero Fellers, MD    [facility] Prenatal Labs  Dating [place] Blood type: A/Positive/-- (08/14 1641)   Genetic Screen declines Antibody:Negative (08/14 1641)  Anatomic [place] Normal anatomy, female, anterior placenta Rubella: <0.90  nonimmune Varicella: Immune  GTT Third trimester:  RPR: Non Reactive (08/14 1641)   Rhogam n/a HBsAg: Negative (08/14 1641)   TDaP vaccine planned  Flu Shot:planned HIV: Non Reactive (08/14 1641)   Baby Food           Breast                     GBS:   Contraception  Pap:2018; plan PP  CBB  No   CS/VBAC n/a   Support Person            Asthma during pregnancy 10/10/2018 by Will Bonnet, MD No       Term labor symptoms and general obstetric precautions including but not limited to vaginal bleeding, contractions, leaking of fluid and fetal movement were reviewed in detail with the patient. Please refer to After Visit Summary for other counseling recommendations.   Return in about 1 week (around 05/29/2019) for rob.  IOL scheduled for 05/29/19 at 8 am Covid Swab 05/27/19 Patient informed of lab and induction timing  Rod Can, [facility] 05/22/2019 4:24 PM

## 2019-05-22 NOTE — Patient Instructions (Signed)
Braxton Hicks Contractions Contractions of the uterus can occur throughout pregnancy, but they are not always a sign that you are in labor. You may have practice contractions called Braxton Hicks contractions. These false labor contractions are sometimes confused with true labor. What are Braxton Hicks contractions? Braxton Hicks contractions are tightening movements that occur in the muscles of the uterus before labor. Unlike true labor contractions, these contractions do not result in opening (dilation) and thinning of the cervix. Toward the end of pregnancy (32-34 weeks), Braxton Hicks contractions can happen more often and may become stronger. These contractions are sometimes difficult to tell apart from true labor because they can be very uncomfortable. You should not feel embarrassed if you go to the hospital with false labor. Sometimes, the only way to tell if you are in true labor is for your health care provider to look for changes in the cervix. The health care provider will do a physical exam and may monitor your contractions. If you are not in true labor, the exam should show that your cervix is not dilating and your water has not broken. If there are no other health problems associated with your pregnancy, it is completely safe for you to be sent home with false labor. You may continue to have Braxton Hicks contractions until you go into true labor. How to tell the difference between true labor and false labor True labor  Contractions last 30-70 seconds.  Contractions become very regular.  Discomfort is usually felt in the top of the uterus, and it spreads to the lower abdomen and low back.  Contractions do not go away with walking.  Contractions usually become more intense and increase in frequency.  The cervix dilates and gets thinner. False labor  Contractions are usually shorter and not as strong as true labor contractions.  Contractions are usually irregular.  Contractions  are often felt in the front of the lower abdomen and in the groin.  Contractions may go away when you walk around or change positions while lying down.  Contractions get weaker and are shorter-lasting as time goes on.  The cervix usually does not dilate or become thin. Follow these instructions at home:   Take over-the-counter and prescription medicines only as told by your health care provider.  Keep up with your usual exercises and follow other instructions from your health care provider.  Eat and drink lightly if you think you are going into labor.  If Braxton Hicks contractions are making you uncomfortable: ? Change your position from lying down or resting to walking, or change from walking to resting. ? Sit and rest in a tub of warm water. ? Drink enough fluid to keep your urine pale yellow. Dehydration may cause these contractions. ? Do slow and deep breathing several times an hour.  Keep all follow-up prenatal visits as told by your health care provider. This is important. Contact a health care provider if:  You have a fever.  You have continuous pain in your abdomen. Get help right away if:  Your contractions become stronger, more regular, and closer together.  You have fluid leaking or gushing from your vagina.  You pass blood-tinged mucus (bloody show).  You have bleeding from your vagina.  You have low back pain that you never had before.  You feel your baby's head pushing down and causing pelvic pressure.  Your baby is not moving inside you as much as it used to. Summary  Contractions that occur before labor are   called Braxton Hicks contractions, false labor, or practice contractions.  Braxton Hicks contractions are usually shorter, weaker, farther apart, and less regular than true labor contractions. True labor contractions usually become progressively stronger and regular, and they become more frequent.  Manage discomfort from [facility] contractions  by changing position, resting in a warm bath, drinking plenty of water, or practicing deep breathing. This information is not intended to replace advice given to you by your health care provider. Make sure you discuss any questions you have with your health care provider. Document Revised: 03/03/2017 Document Reviewed: 08/04/2016 Elsevier Patient Education  [place]. Vaginal Delivery  Vaginal delivery means that you give birth by pushing your baby out of your birth canal (vagina). A team of health care providers will help you before, during, and after vaginal delivery. Birth experiences are unique for every woman and every pregnancy, and birth experiences vary depending on where you choose to give birth. What happens when I arrive at the birth center or hospital? Once you are in labor and have been admitted into the hospital or birth center, your health care provider may:  Review your pregnancy history and any concerns that you have.  Insert an IV into one of your veins. This may be used to give you fluids and medicines.  Check your blood pressure, pulse, temperature, and heart rate (vital signs).  Check whether your bag of water (amniotic sac) has broken (ruptured).  Talk with you about your birth plan and discuss pain control options. Monitoring Your health care provider may monitor your contractions (uterine monitoring) and your baby's heart rate (fetal monitoring). You may need to be monitored:  Often, but not continuously (intermittently).  All the time or for long periods at a time (continuously). Continuous monitoring may be needed if: ? You are taking certain medicines, such as medicine to relieve pain or make your contractions stronger. ? You have pregnancy or labor complications. Monitoring may be done by:  Placing a special stethoscope or a handheld monitoring device on your abdomen to check your baby's heartbeat and to check for contractions.  Placing monitors on  your abdomen (external monitors) to record your baby's heartbeat and the frequency and length of contractions.  Placing monitors inside your uterus through your vagina (internal monitors) to record your baby's heartbeat and the frequency, length, and strength of your contractions. Depending on the type of monitor, it may remain in your uterus or on your baby's head until birth.  Telemetry. This is a type of continuous monitoring that can be done with external or internal monitors. Instead of having to stay in bed, you are able to move around during telemetry. Physical exam Your health care provider may perform frequent physical exams. This may include:  Checking how and where your baby is positioned in your uterus.  Checking your cervix to determine: ? Whether it is thinning out (effacing). ? Whether it is opening up (dilating). What happens during labor and delivery?  Normal labor and delivery is divided into the following three stages: Stage 1  This is the longest stage of labor.  This stage can last for hours or days.  Throughout this stage, you will feel contractions. Contractions generally feel mild, infrequent, and irregular at first. They get stronger, more frequent (about every 2-3 minutes), and more regular as you move through this stage.  This stage ends when your cervix is completely dilated to 4 inches (10 cm) and completely effaced. Stage 2  This stage  starts once your cervix is completely effaced and dilated and lasts until the delivery of your baby.  This stage may last from 20 minutes to 2 hours.  This is the stage where you will feel an urge to push your baby out of your vagina.  You may feel stretching and burning pain, especially when the widest part of your baby's head passes through the vaginal opening (crowning).  Once your baby is delivered, the umbilical cord will be clamped and cut. This usually occurs after waiting a period of 1-2 minutes after  delivery.  Your baby will be placed on your bare chest (skin-to-skin contact) in an upright position and covered with a warm blanket. Watch your baby for feeding cues, like rooting or sucking, and help the baby to your breast for his or her first feeding. Stage 3  This stage starts immediately after the birth of your baby and ends after you deliver the placenta.  This stage may take anywhere from 5 to 30 minutes.  After your baby has been delivered, you will feel contractions as your body expels the placenta and your uterus contracts to control bleeding. What can I expect after labor and delivery?  After labor is over, you and your baby will be monitored closely until you are ready to go home to ensure that you are both healthy. Your health care team will teach you how to care for yourself and your baby.  You and your baby will stay in the same room (rooming in) during your hospital stay. This will encourage early bonding and successful breastfeeding.  You may continue to receive fluids and medicines through an IV.  Your uterus will be checked and massaged regularly (fundal massage).  You will have some soreness and pain in your abdomen, vagina, and the area of skin between your vaginal opening and your anus (perineum).  If an incision was made near your vagina (episiotomy) or if you had some vaginal tearing during delivery, cold compresses may be placed on your episiotomy or your tear. This helps to reduce pain and swelling.  You may be given a squirt bottle to use instead of wiping when you go to the bathroom. To use the squirt bottle, follow these steps: ? Before you urinate, fill the squirt bottle with warm water. Do not use hot water. ? After you urinate, while you are sitting on the toilet, use the squirt bottle to rinse the area around your urethra and vaginal opening. This rinses away any urine and blood. ? Fill the squirt bottle with clean water every time you use the  bathroom.  It is normal to have vaginal bleeding after delivery. Wear a sanitary pad for vaginal bleeding and discharge. Summary  Vaginal delivery means that you will give birth by pushing your baby out of your birth canal (vagina).  Your health care provider may monitor your contractions (uterine monitoring) and your baby's heart rate (fetal monitoring).  Your health care provider may perform a physical exam.  Normal labor and delivery is divided into three stages.  After labor is over, you and your baby will be monitored closely until you are ready to go home. This information is not intended to replace advice given to you by your health care provider. Make sure you discuss any questions you have with your health care provider. Document Revised: 04/25/2017 Document Reviewed: 04/25/2017 Elsevier Patient Education  2020 [employer].

## 2019-05-23 ENCOUNTER — Encounter: Payer: Medicaid Other | Admitting: Obstetrics and Gynecology

## 2019-05-27 ENCOUNTER — Other Ambulatory Visit
Admission: RE | Admit: 2019-05-27 | Discharge: 2019-05-27 | Disposition: A | Discharge status: Home / Self Care | Payer: Medicaid Other | Source: Ambulatory Visit | Attending: Obstetrics and Gynecology | Admitting: Obstetrics and Gynecology

## 2019-05-27 ENCOUNTER — Encounter: Payer: Self-pay | Admitting: Obstetrics and Gynecology

## 2019-05-27 ENCOUNTER — Ambulatory Visit (INDEPENDENT_AMBULATORY_CARE_PROVIDER_SITE_OTHER): Payer: Medicaid Other | Admitting: Obstetrics and Gynecology

## 2019-05-27 ENCOUNTER — Other Ambulatory Visit: Payer: Self-pay

## 2019-05-27 VITALS — BP 106/52 | Wt 173.0 lb

## 2019-05-27 DIAGNOSIS — Z3403 Encounter for supervision of normal first pregnancy, third trimester: Secondary | ICD-10-CM

## 2019-05-27 DIAGNOSIS — Z3A39 39 weeks gestation of pregnancy: Secondary | ICD-10-CM

## 2019-05-27 DIAGNOSIS — O99513 Diseases of the respiratory system complicating pregnancy, third trimester: Secondary | ICD-10-CM

## 2019-05-27 DIAGNOSIS — J45909 Unspecified asthma, uncomplicated: Secondary | ICD-10-CM

## 2019-05-27 DIAGNOSIS — Z3483 Encounter for supervision of other normal pregnancy, third trimester: Secondary | ICD-10-CM

## 2019-05-27 LAB — SARS CORONAVIRUS 2 (TAT 6-24 HRS): SARS Coronavirus 2: NEGATIVE

## 2019-05-27 NOTE — Progress Notes (Signed)
  Routine Prenatal Care Visit  Subjective  Tina Fischer is a 29 y.o. G2P0010 at [redacted]w[redacted]d being seen today for ongoing prenatal care.  She is currently monitored for the following issues for this low-risk pregnancy and has Migraines; Mild intermittent asthma without complication; Allergic rhinitis due to allergen; ADHD; Lipoma; Supervision of low-risk first pregnancy; and Asthma during pregnancy on their problem list.  ----------------------------------------------------------------------------------- Patient reports no complaints.   Contractions: Irregular. Vag. Bleeding: None.  Movement: Present. Leaking Fluid denies.  ----------------------------------------------------------------------------------- The following portions of the patient's history were reviewed and updated as appropriate: allergies, current medications, past family history, past medical history, past social history, past surgical history and problem list. Problem list updated.  Objective  Blood pressure (!) 106/52, weight 173 lb (78.5 kg), last menstrual period 08/26/2018. Pregravid weight 125 lb (56.7 kg) Total Weight Gain 48 lb (21.8 kg) Urinalysis: Urine Protein    Urine Glucose    Fetal Status: Fetal Heart Rate (bpm): 145   Movement: Present  Presentation: Vertex  General:  Alert, oriented and cooperative. Patient is in no acute distress.  Skin: Skin is warm and dry. No rash noted.   Cardiovascular: Normal heart rate noted  Respiratory: Normal respiratory effort, no problems with respiration noted  Abdomen: Soft, gravid, appropriate for gestational age. Pain/Pressure: Present     Pelvic:  Cervical exam deferred        Extremities: Normal range of motion.     Mental Status: Normal mood and affect. Normal behavior. Normal judgment and thought content.   BSUS: cephalic presentation  Assessment   29 y.o. G2P0010 at [redacted]w[redacted]d by  06/02/2019, by Last Menstrual Period presenting for routine prenatal visit  Plan    Pregnancy#2 Problems (from 08/26/18 to present)    Problem Noted Resolved   Supervision of low-risk first pregnancy 10/10/2018 by Will Bonnet, MD No   Overview Addendum 03/13/2019  9:11 AM by Homero Fellers, MD    Clinic [facility] Prenatal Labs  Dating [place] Blood type: A/Positive/-- (08/14 1641)   Genetic Screen declines Antibody:Negative (08/14 1641)  Anatomic [place] Normal anatomy, female, anterior placenta Rubella: <0.90  nonimmune Varicella: Immune  GTT Third trimester:  RPR: Non Reactive (08/14 1641)   Rhogam n/a HBsAg: Negative (08/14 1641)   TDaP vaccine planned  Flu Shot:planned HIV: Non Reactive (08/14 1641)   Baby Food           Breast                     GBS:   Contraception  Pap:2018; plan PP  CBB  No   CS/VBAC n/a   Support Person            Asthma during pregnancy 10/10/2018 by Will Bonnet, MD No       Term labor symptoms and general obstetric precautions including but not limited to vaginal bleeding, contractions, leaking of fluid and fetal movement were reviewed in detail with the patient. Please refer to After Visit Summary for other counseling recommendations.   - Declines cervical check. - per patient request, change IOL to 2/23.  OK with L&D  Return for Keep IOL appt on 2/23.  Prentice Docker, MD, [facility], [facility] 05/27/2019 4:56 PM

## 2019-05-27 NOTE — H&P (Signed)
OB History & Physical   History of Present Illness:  Initial H&P based on clinic visit 05/22/2019  Chief Complaint: patient requests elective induction  HPI:  Tina Fischer is a 29 y.o. G2P0010 female at [redacted]w[redacted]d dated by LMP.  Her pregnancy has been complicated by migraines, mild intermittent asthma without complication.    She denies contractions.   She denies leakage of fluid.   She denies vaginal bleeding.   She reports fetal movement.    Total weight gain for pregnancy: 48 lb (21.8 kg)   Obstetrical Problem List: Pregnancy#2 Problems (from 08/26/18 to present)    Problem Noted Resolved   Supervision of low-risk first pregnancy 10/10/2018 by Will Bonnet, MD No   Overview Addendum 03/13/2019  9:11 AM by Homero Fellers, [place] Prenatal Labs  Dating By LMP c/w 7w u/s Blood type: A/Positive/-- (08/14 1641)   Genetic Screen declines Antibody:Negative (08/14 1641)  Anatomic [place] Normal anatomy, female, anterior placenta Rubella: <0.90  nonimmune Varicella: Immune  GTT Third trimester: 78 RPR: Non Reactive (08/14 1641)   Rhogam n/a HBsAg: Negative (08/14 1641)   Vaccines TDAP: 1/6   Flu Shot: declined HIV: Non Reactive (08/14 1641)   Baby Food Breast                     GBS: negative on 2/3  Contraception  Pap:2018; plan PP  CBB  No   CS/VBAC n/a Genetic screen declined  Support Person            Asthma during pregnancy 10/10/2018 by Will Bonnet, MD No       Maternal Medical History:   Past Medical History:  Diagnosis Date  . Allergy   . Asthma   . Frequent headaches   . Lipoma   . Migraines     Past Surgical History:  Procedure Laterality Date  . TONSILLECTOMY AND ADENOIDECTOMY  2008  . WISDOM TOOTH EXTRACTION  2017    No Known Allergies  Prior to Admission medications   Medication Sig Start Date End Date Taking? Authorizing Provider  albuterol (VENTOLIN HFA) 108 (90 Base) MCG/ACT inhaler Inhale 2 puffs into the lungs every 6  (six) hours as needed for wheezing or shortness of breath. 04/30/19   Pleas Koch, NP  cetirizine (ZYRTEC) 10 MG tablet Take by mouth.    [provider]  famotidine-calcium carbonate-magnesium hydroxide (PEPCID COMPLETE) 10-800-165 MG chewable tablet Chew 1 tablet by mouth daily as needed.    [provider]  montelukast (SINGULAIR) 10 MG tablet Take 1 tablet (10 mg total) by mouth at bedtime. For asthma/allergies. 04/30/19   Pleas Koch, NP  Prenat-FeFum-FePo-FA-Omega 3 (CONCEPT DHA) 53.5-38-1 MG CAPS Take 1 capsule by mouth daily.    [provider]    OB History  Gravida Para Term Preterm AB Living  2 0     1    SAB TAB Ectopic Multiple Live Births  1       0    # Outcome Date GA Lbr Len/2nd Weight Sex Delivery Anes PTL Lv  2 Current           1 SAB 2016            Prenatal care site: [facility]  Social History: She  reports that she quit smoking about a year ago. Her smoking use included cigarettes. She has never used smokeless tobacco. She reports current alcohol use. She reports  current drug use. Drug: Marijuana.  Family History: family history includes Cancer (age of onset: 27) in her maternal grandmother; Cancer (age of onset: 16) in her paternal grandfather; Depression in her paternal grandfather and paternal grandmother; Early death in her maternal grandfather and maternal grandmother.    Review of Systems:  Review of Systems  Constitutional: Negative.   HENT: Negative.   Eyes: Negative.   Respiratory: Negative.   Cardiovascular: Negative.   Gastrointestinal: Negative.   Genitourinary: Negative.   Musculoskeletal: Negative.   Skin: Negative.   Neurological: Negative.   Endo/Heme/Allergies: Negative.   Psychiatric/Behavioral: Negative.      Physical Exam:  BP (!) 141/76   Wt 173 lb (78.5 kg)   LMP 08/26/2018 (Exact Date)   BMI 29.24 kg/m  Constitutional: Well nourished, well developed female in no acute distress.   HEENT: normal Skin: Warm and dry.  Cardiovascular: Regular rate and rhythm.   Extremity: trace edema  Respiratory: Clear to auscultation bilateral. Normal respiratory effort Abdomen: FHT present Back: no CVAT Neuro: DTRs 2+, Cranial nerves grossly intact Psych: Alert and Oriented x3. No memory deficits. Normal mood and affect.  MS: normal gait, normal bilateral lower extremity ROM/strength/stability.  Pelvic exam: (female chaperone present) is not limited by body habitus EGBUS: within normal limits Vagina: within normal limits and with normal mucosa  Cervix: closed   Lab Results  Component Value Date   [place] Not Detected 02/25/2019  Pre-admission Covid test pending  Assessment:  Tina Fischer is a 29 y.o. G85P0010 female at [redacted]w[redacted]d with elective induction of labor scheduled for 05/28/2019.   Plan:  1. Admit to Labor & Delivery  2. CBC, T&S, Clrs, IVF 3. GBS negative.   4. Fetal well-being: reassuring 5. Begin induction with cervical ripening/cytotec   Rod Can, [facility] 05/27/2019 6:27 PM

## 2019-05-28 ENCOUNTER — Inpatient Hospital Stay: Payer: Medicaid Other | Admitting: Anesthesiology

## 2019-05-28 ENCOUNTER — Inpatient Hospital Stay
Admission: EM | Admit: 2019-05-28 | Discharge: 2019-05-30 | DRG: 807 | Disposition: A | Discharge status: Home / Self Care | Payer: Medicaid Other | Attending: Obstetrics and Gynecology | Admitting: Obstetrics and Gynecology

## 2019-05-28 ENCOUNTER — Encounter: Payer: Self-pay | Admitting: Advanced Practice Midwife

## 2019-05-28 DIAGNOSIS — Z20822 Contact with and (suspected) exposure to covid-19: Secondary | ICD-10-CM | POA: Diagnosis present

## 2019-05-28 DIAGNOSIS — Z87891 Personal history of nicotine dependence: Secondary | ICD-10-CM

## 2019-05-28 DIAGNOSIS — O26893 Other specified pregnancy related conditions, third trimester: Secondary | ICD-10-CM | POA: Diagnosis present

## 2019-05-28 DIAGNOSIS — Z3A39 39 weeks gestation of pregnancy: Secondary | ICD-10-CM

## 2019-05-28 DIAGNOSIS — J45909 Unspecified asthma, uncomplicated: Secondary | ICD-10-CM

## 2019-05-28 DIAGNOSIS — Z3400 Encounter for supervision of normal first pregnancy, unspecified trimester: Secondary | ICD-10-CM

## 2019-05-28 DIAGNOSIS — O99519 Diseases of the respiratory system complicating pregnancy, unspecified trimester: Secondary | ICD-10-CM

## 2019-05-28 DIAGNOSIS — Z3403 Encounter for supervision of normal first pregnancy, third trimester: Secondary | ICD-10-CM

## 2019-05-28 LAB — CBC
HCT: 37.1 % (ref 36.0–46.0)
Hemoglobin: 12.8 g/dL (ref 12.0–15.0)
MCH: 32.5 pg (ref 26.0–34.0)
MCHC: 34.5 g/dL (ref 30.0–36.0)
MCV: 94.2 fL (ref 80.0–100.0)
Platelets: 236 10*3/uL (ref 150–400)
RBC: 3.94 MIL/uL (ref 3.87–5.11)
RDW: 12.7 % (ref 11.5–15.5)
WBC: 10.6 10*3/uL — ABNORMAL HIGH (ref 4.0–10.5)
nRBC: 0 % (ref 0.0–0.2)

## 2019-05-28 LAB — TYPE AND SCREEN
ABO/RH(D): A POS
Antibody Screen: NEGATIVE

## 2019-05-28 MED ORDER — COCONUT OIL OIL
1.0000 "application " | TOPICAL_OIL | Status: DC | PRN
  Filled 2019-05-28: qty 120

## 2019-05-28 MED ORDER — FERROUS SULFATE 325 (65 FE) MG PO TABS
325.0000 mg | ORAL_TABLET | Freq: Two times a day (BID) | ORAL | Status: DC
  Administered 2019-05-29 – 2019-05-30 (×3): 325 mg via ORAL
  Filled 2019-05-28 (×3): qty 1

## 2019-05-28 MED ORDER — FENTANYL 2.5 MCG/ML W/ROPIVACAINE 0.15% IN NS 100 ML EPIDURAL (ARMC)
12.0000 mL/h | EPIDURAL | Status: DC
  Administered 2019-05-28: 12 mL/h via EPIDURAL

## 2019-05-28 MED ORDER — LACTATED RINGERS IV SOLN: INTRAVENOUS | Status: DC

## 2019-05-28 MED ORDER — TERBUTALINE SULFATE 1 MG/ML IJ SOLN: 0.2500 mg | Freq: Once | INTRAMUSCULAR | Status: DC | PRN

## 2019-05-28 MED ORDER — CALCIUM CARBONATE ANTACID 500 MG PO CHEW: 200.0000 mg | CHEWABLE_TABLET | Freq: Every day | ORAL | Status: DC | PRN

## 2019-05-28 MED ORDER — PHENYLEPHRINE 40 MCG/ML (10ML) SYRINGE FOR IV PUSH (FOR BLOOD PRESSURE SUPPORT): 80.0000 ug | PREFILLED_SYRINGE | INTRAVENOUS | Status: DC | PRN

## 2019-05-28 MED ORDER — FAMOTIDINE 20 MG PO TABS
ORAL_TABLET | ORAL | Status: AC
  Filled 2019-05-28: qty 1

## 2019-05-28 MED ORDER — SENNOSIDES-DOCUSATE SODIUM 8.6-50 MG PO TABS
2.0000 | ORAL_TABLET | ORAL | Status: DC
  Administered 2019-05-29 – 2019-05-30 (×2): 2 via ORAL
  Filled 2019-05-28 (×2): qty 2

## 2019-05-28 MED ORDER — ALBUTEROL SULFATE (2.5 MG/3ML) 0.083% IN NEBU: 2.5000 mg | INHALATION_SOLUTION | Freq: Four times a day (QID) | RESPIRATORY_TRACT | Status: DC | PRN

## 2019-05-28 MED ORDER — ONDANSETRON HCL 4 MG/2ML IJ SOLN: 4.0000 mg | Freq: Four times a day (QID) | INTRAMUSCULAR | Status: DC | PRN

## 2019-05-28 MED ORDER — MAGNESIUM HYDROXIDE 400 MG/5ML PO SUSP: 15.0000 mL | Freq: Every day | ORAL | Status: DC | PRN

## 2019-05-28 MED ORDER — ONDANSETRON HCL 4 MG PO TABS: 4.0000 mg | ORAL_TABLET | ORAL | Status: DC | PRN

## 2019-05-28 MED ORDER — OXYTOCIN 40 UNITS IN NORMAL SALINE INFUSION - SIMPLE MED
2.5000 [IU]/h | INTRAVENOUS | Status: DC
  Filled 2019-05-28: qty 1000

## 2019-05-28 MED ORDER — LIDOCAINE HCL (PF) 1 % IJ SOLN
30.0000 mL | INTRAMUSCULAR | Status: AC | PRN
  Administered 2019-05-28: 3 mL via SUBCUTANEOUS
  Filled 2019-05-28: qty 30

## 2019-05-28 MED ORDER — BUPIVACAINE HCL (PF) 0.25 % IJ SOLN
INTRAMUSCULAR | Status: DC | PRN
  Administered 2019-05-28 (×2): 5 mL via EPIDURAL

## 2019-05-28 MED ORDER — ONDANSETRON HCL 4 MG/2ML IJ SOLN: 4.0000 mg | INTRAMUSCULAR | Status: DC | PRN

## 2019-05-28 MED ORDER — LACTATED RINGERS IV SOLN: 500.0000 mL | INTRAVENOUS | Status: DC | PRN

## 2019-05-28 MED ORDER — WITCH HAZEL-GLYCERIN EX PADS
1.0000 "application " | MEDICATED_PAD | CUTANEOUS | Status: DC | PRN
  Filled 2019-05-28 (×2): qty 100

## 2019-05-28 MED ORDER — LACTATED RINGERS IV SOLN: 500.0000 mL | Freq: Once | INTRAVENOUS | Status: DC

## 2019-05-28 MED ORDER — FAMOTIDINE 20 MG PO TABS
20.0000 mg | ORAL_TABLET | Freq: Two times a day (BID) | ORAL | Status: DC
  Administered 2019-05-28: 20 mg via ORAL

## 2019-05-28 MED ORDER — OXYTOCIN 40 UNITS IN NORMAL SALINE INFUSION - SIMPLE MED
1.0000 m[IU]/min | INTRAVENOUS | Status: DC
  Administered 2019-05-28: 2 m[IU]/min via INTRAVENOUS

## 2019-05-28 MED ORDER — MISOPROSTOL 25 MCG QUARTER TABLET
25.0000 ug | ORAL_TABLET | ORAL | Status: DC | PRN
  Administered 2019-05-28: 25 ug via VAGINAL
  Filled 2019-05-28: qty 1

## 2019-05-28 MED ORDER — IBUPROFEN 600 MG PO TABS
600.0000 mg | ORAL_TABLET | Freq: Four times a day (QID) | ORAL | Status: DC
  Administered 2019-05-28 – 2019-05-30 (×7): 600 mg via ORAL
  Filled 2019-05-28 (×7): qty 1

## 2019-05-28 MED ORDER — LACTATED RINGERS AMNIOINFUSION
INTRAVENOUS | Status: DC
  Filled 2019-05-28 (×3): qty 1000

## 2019-05-28 MED ORDER — PRENATAL MULTIVITAMIN CH
1.0000 | ORAL_TABLET | Freq: Every day | ORAL | Status: DC
  Administered 2019-05-29 – 2019-05-30 (×2): 1 via ORAL
  Filled 2019-05-28 (×2): qty 1

## 2019-05-28 MED ORDER — DIPHENHYDRAMINE HCL 25 MG PO CAPS: 25.0000 mg | ORAL_CAPSULE | Freq: Four times a day (QID) | ORAL | Status: DC | PRN

## 2019-05-28 MED ORDER — OXYTOCIN BOLUS FROM INFUSION: 500.0000 mL | Freq: Once | INTRAVENOUS | Status: DC

## 2019-05-28 MED ORDER — FENTANYL 2.5 MCG/ML W/ROPIVACAINE 0.15% IN NS 100 ML EPIDURAL (ARMC)
EPIDURAL | Status: AC
  Filled 2019-05-28: qty 100

## 2019-05-28 MED ORDER — ACETAMINOPHEN 325 MG PO TABS
650.0000 mg | ORAL_TABLET | ORAL | Status: DC | PRN
  Administered 2019-05-29 – 2019-05-30 (×6): 650 mg via ORAL
  Filled 2019-05-28 (×6): qty 2

## 2019-05-28 MED ORDER — HYDROCODONE-ACETAMINOPHEN 5-325 MG PO TABS: 1.0000 | ORAL_TABLET | Freq: Four times a day (QID) | ORAL | Status: DC | PRN

## 2019-05-28 MED ORDER — MISOPROSTOL 200 MCG PO TABS
ORAL_TABLET | ORAL | Status: AC
  Filled 2019-05-28: qty 4

## 2019-05-28 MED ORDER — DIBUCAINE (PERIANAL) 1 % EX OINT
1.0000 "application " | TOPICAL_OINTMENT | CUTANEOUS | Status: DC | PRN
  Filled 2019-05-28 (×2): qty 28

## 2019-05-28 MED ORDER — SIMETHICONE 80 MG PO CHEW: 80.0000 mg | CHEWABLE_TABLET | ORAL | Status: DC | PRN

## 2019-05-28 MED ORDER — BUTORPHANOL TARTRATE 1 MG/ML IJ SOLN: 1.0000 mg | INTRAMUSCULAR | Status: DC | PRN

## 2019-05-28 MED ORDER — EPHEDRINE 5 MG/ML INJ: 10.0000 mg | INTRAVENOUS | Status: DC | PRN

## 2019-05-28 MED ORDER — DIPHENHYDRAMINE HCL 50 MG/ML IJ SOLN: 12.5000 mg | INTRAMUSCULAR | Status: DC | PRN

## 2019-05-28 MED ORDER — FAMOTIDINE-CA CARB-MAG HYDROX 10-800-165 MG PO CHEW: 1.0000 | CHEWABLE_TABLET | Freq: Every day | ORAL | Status: DC | PRN

## 2019-05-28 MED ORDER — FAMOTIDINE 40 MG/5ML PO SUSR
10.0000 mg | Freq: Every day | ORAL | Status: DC | PRN
  Filled 2019-05-28: qty 2.5

## 2019-05-28 MED ORDER — BENZOCAINE-MENTHOL 20-0.5 % EX AERO
1.0000 "application " | INHALATION_SPRAY | CUTANEOUS | Status: DC | PRN
  Administered 2019-05-28: 1 via TOPICAL
  Filled 2019-05-28 (×2): qty 56

## 2019-05-28 MED ORDER — ACETAMINOPHEN 325 MG PO TABS: 650.0000 mg | ORAL_TABLET | ORAL | Status: DC | PRN

## 2019-05-28 MED ORDER — MEASLES, MUMPS & RUBELLA VAC IJ SOLR
0.5000 mL | INTRAMUSCULAR | Status: AC | PRN
  Administered 2019-05-30: 0.5 mL via SUBCUTANEOUS
  Filled 2019-05-28 (×2): qty 0.5

## 2019-05-28 MED ORDER — LIDOCAINE-EPINEPHRINE (PF) 1.5 %-1:200000 IJ SOLN
INTRAMUSCULAR | Status: DC | PRN
  Administered 2019-05-28: 3 mL via PERINEURAL

## 2019-05-28 NOTE — H&P (Signed)
See H&P dated 05/27/2019. Updates as below  Patient with no complaints.  Notes +Fm, no LOF, no VB.  No contractions.   BP 123/72 (BP Location: Left Arm)   Pulse 83   Temp 98.6 F (37 C) (Oral)   Resp 16   Ht 5\' 4"  (1.626 m)   Wt 78.5 kg   LMP 08/26/2018 (Exact Date)   SpO2 99%   BMI 29.70 kg/m   Exam unchanged except cervical exam 1/thick/-3 Foley balloon and misoprostol placed.  See progress note for fetal tracing Category 1  29 y.o. G57P0010 female at [redacted]w[redacted]d here for elective IOL GBS negative on 2/3. Induction as above. Discussed in more detail yesterday, but reviewed overall plan with patient and husband Tina Fischer. All questions answered.   Prentice Docker, MD, [facility], [facility] 05/28/2019 9:35 AM  I

## 2019-05-28 NOTE — Progress Notes (Signed)
In to see patient due to recurrent variable decelerations.  The patient had just had her urinary catheter placed and her cervical exam was unchanged (per RN) at 4 cm.   Variable decelerations to the 80s with several contractions with return to baseline in between with moderate variability and accelerations.  Position changes (started on her back, then to left lateral, then to right lateral).  IVF bolus, pitocin cut from 8 to 4 milliUnits/min with resolution of variables. Now baseline 130 bpm, moderate variability, +accelerations, no decelerations.  (category 1) Patient comfortable with epidural.   Discussed situation with patient and her husband. All questions answered.   Prentice Docker, MD, [facility], [facility] 05/28/2019 2:52 PM

## 2019-05-28 NOTE — Progress Notes (Signed)
Labor Check  Subj:  Complaints: comfortable   Obj:  BP 123/72 (BP Location: Left Arm)   Pulse 83   Temp 98.6 F (37 C) (Oral)   Resp 16   Ht 5\' 4"  (1.626 m)   Wt 78.5 kg   LMP 08/26/2018 (Exact Date)   SpO2 99%   BMI 29.70 kg/m     Cervix: Dilation: 1 / Effacement (%): Thick / Station: Ballotable   Foley balloon placed digitally, filled with 40 mL sterile saline.   Misoprostol 25 mcg placed vaginally Baseline FHR: 135 beats/min   Variability: moderate   Accelerations: present   Decelerations: absent Contractions: present frequency: infrequent Overall assessment: cat 1  A/P: 29 y.o. G2P0010 female at [redacted]w[redacted]d with Elective IOL.  1.  Labor: foley balloon and misoprostol 25 mcg placed without difficulty.  Patient tolerated well.  Will start pitocin and AROM, as indicated later in the process.  2.  FWB: reassuring, Overall assessment: category 1  3.  GBS negative on 2/3  4.  Pain: prn, epidural when ready 5.  Recheck: prn.   Prentice Docker, MD, [facility], [facility] 05/28/2019 9:31 AM

## 2019-05-28 NOTE — Anesthesia Preprocedure Evaluation (Signed)
Anesthesia Evaluation  Patient identified by MRN, date of birth, ID band Patient awake    Reviewed: Allergy & Precautions, H&P , NPO status , Patient's Chart, lab work & pertinent test results  History of Anesthesia Complications Negative for: history of anesthetic complications  Airway Mallampati: II  TM Distance: <3 FB Neck ROM: full    Dental no notable dental hx.    Pulmonary neg pulmonary ROS, former smoker,    Pulmonary exam normal        Cardiovascular negative cardio ROS Normal cardiovascular exam     Neuro/Psych    GI/Hepatic negative GI ROS, Neg liver ROS,   Endo/Other  negative endocrine ROS  Renal/GU negative Renal ROS  negative genitourinary   Musculoskeletal   Abdominal   Peds  Hematology negative hematology ROS (+)   Anesthesia Other Findings   Reproductive/Obstetrics (+) Pregnancy                             Anesthesia Physical Anesthesia Plan  ASA: II  Anesthesia Plan: Epidural   Post-op Pain Management:    Induction:   PONV Risk Score and Plan:   Airway Management Planned:   Additional Equipment:   Intra-op Plan:   Post-operative Plan:   Informed Consent: I have reviewed the patients History and Physical, chart, labs and discussed the procedure including the risks, benefits and alternatives for the proposed anesthesia with the patient or authorized representative who has indicated his/her understanding and acceptance.       Plan Discussed with: Anesthesiologist and CRNA  Anesthesia Plan Comments:         Anesthesia Quick Evaluation

## 2019-05-28 NOTE — Progress Notes (Signed)
Called to see patient due to recurrent deep variables (one to the 60s), late in timing, but variable by characteristics.  She had SROM at about 350PM per RN.   SVE: 4/70/-1.  IUPC placed without difficulty. Position changes and decrease in pitocin dosing.   Start amnioinfusion (231mL bolus and 172ml/hr rate).   Category 1 after position changes and decreased pitocin dosing.   Continue to monitor closely.  Prentice Docker, MD, [facility], [facility] 05/28/2019 4:56 PM

## 2019-05-28 NOTE — Anesthesia Procedure Notes (Signed)
Epidural Patient location during procedure: OB Start time: 05/28/2019 1:36 PM End time: 05/28/2019 1:50 PM  Staffing Resident/CRNA: Nelda Marseille, CRNA Performed: resident/CRNA   Preanesthetic Checklist Completed: patient identified, IV checked, site marked, risks and benefits discussed, surgical consent, monitors and equipment checked, pre-op evaluation and timeout performed  Epidural Patient position: sitting Prep: Betadine Patient monitoring: heart rate, continuous pulse ox and blood pressure Approach: midline Location: L3-L4 Injection technique: LOR saline and LOR air  Needle:  Needle type: Tuohy  Needle gauge: 17 G Needle length: 9 cm and 9 Catheter type: closed end flexible Catheter size: 20 Guage Test dose: negative and 1.5% lidocaine with Epi 1:200 K  Assessment Sensory level: T10 Events: blood not aspirated, injection not painful, no injection resistance, no paresthesia and negative IV test  Additional Notes   Patient tolerated the insertion well without complications.Reason for block:procedure for pain

## 2019-05-29 LAB — RPR: RPR Ser Ql: NONREACTIVE

## 2019-05-29 LAB — CBC
HCT: 36.4 % (ref 36.0–46.0)
Hemoglobin: 12.8 g/dL (ref 12.0–15.0)
MCH: 33.3 pg (ref 26.0–34.0)
MCHC: 35.2 g/dL (ref 30.0–36.0)
MCV: 94.8 fL (ref 80.0–100.0)
Platelets: 199 10*3/uL (ref 150–400)
RBC: 3.84 MIL/uL — ABNORMAL LOW (ref 3.87–5.11)
RDW: 12.9 % (ref 11.5–15.5)
WBC: 15.8 10*3/uL — ABNORMAL HIGH (ref 4.0–10.5)
nRBC: 0 % (ref 0.0–0.2)

## 2019-05-29 NOTE — Anesthesia Postprocedure Evaluation (Signed)
Anesthesia Post Note  Patient: Tina Fischer  Procedure(s) Performed: AN AD [place]  Patient location during evaluation: Mother Baby Anesthesia Type: Epidural Level of consciousness: awake and alert Pain management: pain level controlled Vital Signs Assessment: post-procedure vital signs reviewed and stable Respiratory status: spontaneous breathing, nonlabored ventilation and respiratory function stable Cardiovascular status: stable Postop Assessment: no headache, no backache, epidural receding and able to ambulate Anesthetic complications: no     Last Vitals:  Vitals:   05/29/19 0307 05/29/19 0809  BP: 103/63 (!) 107/10  Pulse: (!) 59 70  Resp: 18 17  Temp: 36.4 C 36.7 C  SpO2: 100% 99%    Last Pain:  Vitals:   05/29/19 0809  TempSrc: Oral  PainSc:                  [name]

## 2019-05-29 NOTE — Lactation Note (Signed)
This note was copied from a baby's chart. Lactation Consultation Note  Patient Name: Tina Fischer [id number] Date: 05/29/2019 Reason for consult: Mother's request;Primapara;1st time breastfeeding;Term  Mom called out for [facility] support. Parents are attempting a feed due to last successful feed around 3.5hrs ago. Mom unwrapped baby, and independently brought into football hold on right breast. Baby was positioned well, mom sandwiched the breast tissue; nipple appearing flat, but tissue compressible. Nipple rubbed up and down nose to mouth, expressed colostrum on the tip of nipple to encourage latch; baby uninterested- sleeping at mom's side. LC discussed feeding options, mom desiring to promote adequate milk supply was open to hand expression. [place] retrieved milk collection vial, and curved tip syringe. LC was able to easily express colostrum, approximately 7-91mL into collection vial; [place] educating mom on hand expression, positioning of hands, and technique. Reviewed benefits of frequent breast stimulation and emptying through hand expression today while baby is sleepy. Parents grateful for assistance. Plan is to attempt next feeding on opposite breast, followed by hand expression if needed.  Maternal Data Formula Feeding for Exclusion: No Has patient been taught Hand Expression?: Yes Does the patient have breastfeeding experience prior to this delivery?: No  Feeding Feeding Type: Breast Fed  LATCH Score                   Interventions Interventions: Breast feeding basics reviewed;Breast massage;Hand express;Adjust position;Support pillows  Lactation Tools Discussed/Used WIC Program: No   Consult Status Consult Status: Follow-up Date: 05/29/19 Follow-up type: In-patient    Tina Fischer 05/29/2019, 11:10 AM

## 2019-05-29 NOTE — Discharge Summary (Signed)
OB Discharge Summary     Patient Name: Tina Fischer DOB: 03/01/1991 MRN: [id number]  Date of admission: 05/28/2019 Delivering MD: Prentice Docker, MD  Date of Delivery: 05/29/2019  Date of discharge: 05/30/2019  Admitting diagnosis: Indication for care in labor and delivery, antepartum [O75.9] Intrauterine pregnancy: [redacted]w[redacted]d     Secondary diagnosis: None     Discharge diagnosis: Term Pregnancy Delivered                                                                                                Post partum procedures:none  Augmentation: Pitocin, Cytotec and Foley Balloon  Complications: None  Hospital course:  Induction of Labor With Vaginal Delivery   29 y.o. yo G2P1011 at [redacted]w[redacted]d was admitted to the hospital 05/28/2019 for induction of labor.  Indication for induction: elective.  Patient had an uncomplicated labor course as follows: Membrane Rupture Time/Date: 3:50 PM ,05/28/2019   Intrapartum Procedures: Episiotomy: None [1]                                         Lacerations:  2nd degree [3];Vaginal [6]  Patient had delivery of a Viable infant.  Information for the patient's newborn:  Leokadia, Dorries [id number]  Delivery Method: Vag-Spont    05/28/2019  Details of delivery can be found in separate delivery note.  Patient had a routine postpartum course. Patient is discharged home 05/30/19.  Physical exam  Vitals:   05/29/19 1340 05/29/19 1630 05/29/19 2336 05/30/19 0726  BP: (!) 108/56 112/62 107/61 104/65  Pulse: 76 (!) 58 61 64  Resp: 18  16 18   Temp: 98.3 F (36.8 C) 98.5 F (36.9 C) 98.6 F (37 C) 98.5 F (36.9 C)  TempSrc: Oral Oral Oral Oral  SpO2: 97% 99% 98% 96%  Weight:      Height:       General: alert and cooperative Lochia: appropriate Uterine Fundus: firm Incision: N/A DVT Evaluation: No evidence of DVT seen on physical exam.  Labs: Lab Results  Component Value Date   WBC 15.8 (H) 05/29/2019   HGB 12.8 05/29/2019   HCT 36.4 05/29/2019    MCV 94.8 05/29/2019   PLT 199 05/29/2019    Discharge instruction: per After Visit Summary.  Medications:  Allergies as of 05/30/2019   No Known Allergies     Medication List    TAKE these medications   acetaminophen 500 MG tablet Commonly known as: TYLENOL Take 2 tablets (1,000 mg total) by mouth every 6 (six) hours as needed for mild pain. What changed:   how much to take  reasons to take this   albuterol 108 (90 Base) MCG/ACT inhaler Commonly known as: VENTOLIN HFA Inhale 2 puffs into the lungs every 6 (six) hours as needed for wheezing or shortness of breath.   cetirizine 10 MG tablet Commonly known as: ZYRTEC Take by mouth.   Concept DHA 53.5-38-1 MG Caps Take 1 capsule by mouth daily.   docusate sodium 100 MG capsule Commonly known  as: Colace Take 1 capsule (100 mg total) by mouth 2 (two) times daily as needed.   famotidine-calcium carbonate-magnesium hydroxide 10-800-165 MG chewable tablet Commonly known as: PEPCID COMPLETE Chew 1 tablet by mouth daily as needed.   ibuprofen 600 MG tablet Commonly known as: ADVIL Take 1 tablet (600 mg total) by mouth every 6 (six) hours.   montelukast 10 MG tablet Commonly known as: SINGULAIR Take 1 tablet (10 mg total) by mouth at bedtime. For asthma/allergies.            Discharge Care Instructions  (From admission, onward)         Start     Ordered   05/30/19 0000  Discharge wound care:    Comments: SHOWER DAILY Wash incision gently with soap and water.  Call office with any drainage, redness, or firmness of the incision.   05/30/19 0908          Diet: routine diet  Activity: Advance as tolerated. Pelvic rest for 6 weeks.   Outpatient follow up: Follow-up Information    Will Bonnet, MD. Schedule an appointment as soon as possible for a visit in 6 week(s).   Specialty: Obstetrics and Gynecology Why: Six weeks postpartum visit Contact information: [address] [place]  [phone]             Postpartum contraception: None Rhogam Given postpartum: no Rubella vaccine given postpartum: yes Varicella vaccine given postpartum: no TDaP given antepartum or postpartum: declined Flu Vaccine: declined  Newborn Data: Live born female "[name]" Birth Weight: 6 lb 9.1 oz (2,980 g) APGAR: 9, 9  Newborn Delivery   Birth date/time: 05/28/2019 18:22:00 Delivery type: Vaginal, Spontaneous       Baby Feeding: Breast  Disposition:home with mother  SIGNED:  Adrian Prows MD [facility] 05/30/2019 9:10 AM

## 2019-05-29 NOTE — Lactation Note (Signed)
This note was copied from a baby's chart. Lactation Consultation Note  Patient Name: Tina Fischer [id number] Date: 05/29/2019 Reason for consult: Follow-up assessment;Difficult latch;1st time breastfeeding;Primapara;Term(flat nipple)  LC in to check on mom and baby [name]. Mom reports an attempt with breastfeeding around 1pm, achieving only a few good sucks before baby fell asleep.  LC encouraged removing baby's swaddle, bringing skin to skin, gently arousal to see if baby would begin to cue. Baby appeared content on mom's chest, non-cuing. LC brought baby to crib and began gentle rubbing, and checked the diaper; stool diaper, dad changed. Baby brought back to mom now more alert and active, into football hold on left breast; mom able to independently position baby. Multiple efforts made by both mom and LC to encourage a latch- baby sucking on tongue and upper lip, uninterested in expressed colostrum on mom's nipple or placed onto lips. Baby began to fall asleep, and was passed to dad. [place] assist mom with hand expression of left breast, retrieving approximately 59mL over the course of about 10 minutes.  [place] educated and taught parents about finger feeding with use of curved tip syringe. Baby slowly opened and accepted LC's finger and consumed approximately 22mL of expressed colostrum before beginning to push finger back out of mouth.  Due to concerns regarding no voids at 20 hours of life, LC did return to room with nipple shield based on baby's reaction and acceptance of finger feed. Parents educated on nipple shield: placement, cleaning, and purpose. Mom had baby in cradle hold; [place] assisted with first placement of nipple shield and moving baby into good position and alignment. Baby accepted the nipple shield easily, but gave no encouraging sucks or attempts at feeding; small amount of colostrum placed into nipple shield, again to encourage baby to feed. Baby uninterested.  LC could visibly tell mom was  becoming tired, and tense, dad began to do a neck/shoulder/back rub to help, and mom desired to take a break at this time. Parents are both active and involved and breastfeeding is their desired feeding method. Mom is well versed in breastfeeding education, and feels that baby will improve. [place] praised parents for their dedication and choice to give baby breastmilk, encouraged continued efforts, and giving of the expressed colostrum collected of about 43mL total at this time. Mom plans to follow-up with colostrum after taking a few minutes of a break. Mom and dad are educated on the benefits of frequent breast stimulation, milk removal through hand expression, and introduction of pump if needed. Mom has 2 of her own personal EBP's one of which she brought with her. We have been able to easily hand express today 2 different times after latching attempts. Encouraged to continue with frequent stimulation and milk removal overnight, and parents educated on potential for 24 hour cluster feeding occurrence, and putting baby to breast on cue.   Maternal Data Formula Feeding for Exclusion: No Has patient been taught Hand Expression?: Yes Does the patient have breastfeeding experience prior to this delivery?: No  Feeding Feeding Type: Breast Fed  LATCH Score Latch: Repeated attempts needed to sustain latch, nipple held in mouth throughout feeding, stimulation needed to elicit sucking reflex.  Audible Swallowing: None  Type of Nipple: Flat  Comfort (Breast/Nipple): Soft / non-tender  Hold (Positioning): Assistance needed to correctly position infant at breast and maintain latch.  LATCH Score: 5  Interventions Interventions: Breast feeding basics reviewed;Assisted with latch;Adjust position;Support pillows  Lactation Tools Discussed/Used Tools: Nipple Shields Nipple shield  size: 20 WIC Program: No   Consult Status Consult Status: Follow-up Date: 05/29/19 Follow-up type:  In-patient    [name] 05/29/2019, 4:05 PM

## 2019-05-29 NOTE — Lactation Note (Signed)
This note was copied from a baby's chart. Lactation Consultation Note  Patient Name: Tina Fischer Pugliese [id number] Date: 05/29/2019 Reason for consult: Initial assessment;Primapara;1st time breastfeeding;Term  LC in to see mom and baby [name]. This is mom's first baby; first breastfeeding experience. Mom feels feedings have gone well thus far, baby "latching better over time". Last feeding recorded was around 0645 for 12 minutes, mom stating baby fell asleep with feeding attempt a few minutes ago. LC reviewed newborn feeding behaviors and expectations in the first 24 hours, void/stool expectations, early hunger cues. Mom desires to work on feedings today with [place] assistance to try different positions and ensure good latch from baby. Baby is sleeping well in dad's arms at this time. LC encouraged mom to rest, and left name/number on whiteboard to call when baby begins to cue for next feeding.  Maternal Data Formula Feeding for Exclusion: No Has patient been taught Hand Expression?: Yes Does the patient have breastfeeding experience prior to this delivery?: No  Feeding Feeding Type: Breast Fed  LATCH Score                   Interventions Interventions: Breast feeding basics reviewed  Lactation Tools Discussed/Used     Consult Status Consult Status: Follow-up Date: 05/29/19 Follow-up type: In-patient    [name] 05/29/2019, 9:58 AM

## 2019-05-29 NOTE — Progress Notes (Signed)
PPD #1 Subjective:  Doing well. Working with lactation on breast feeding. Bleeding slowing. Voiding without difficulty. Eating a regular diet    Objective:  Blood pressure 107/70, pulse 70, temperature 98 F (36.7 C), temperature source Oral, resp. rate 17, height 5' 4"  (1.626 m), weight 78.5 kg, last menstrual period 08/26/2018, SpO2 99 %, unknown if currently breastfeeding.  General: WF in NAD Abdomen: non-distended, non-tender, fundus firm at level of umbilicus Lochia: appropriate Extremities: no edema, no erythema, no tenderness  Results for orders placed or performed during the hospital encounter of 05/28/19 (from the past 72 hour(s))  CBC     Status: Abnormal   Collection Time: 05/28/19  8:27 AM  Result Value Ref Range   WBC 10.6 (H) 4.0 - 10.5 K/uL   RBC 3.94 3.87 - 5.11 MIL/uL   Hemoglobin 12.8 12.0 - 15.0 g/dL   HCT 37.1 36.0 - 46.0 %   MCV 94.2 80.0 - 100.0 fL   MCH 32.5 26.0 - 34.0 pg   MCHC 34.5 30.0 - 36.0 g/dL   RDW 12.7 11.5 - 15.5 %   Platelets 236 150 - 400 K/uL   nRBC 0.0 0.0 - 0.2 %    Comment: Performed at [facility], [address] [place], [place] [address]  Type and screen     Status: None   Collection Time: 05/28/19  8:27 AM  Result Value Ref Range   ABO/RH(D) A POS    Antibody Screen NEG    Sample Expiration      05/31/2019,2359 Performed at [facility], [address] [place], [place] [address]   CBC     Status: Abnormal   Collection Time: 05/29/19  5:20 AM  Result Value Ref Range   WBC 15.8 (H) 4.0 - 10.5 K/uL   RBC 3.84 (L) 3.87 - 5.11 MIL/uL   Hemoglobin 12.8 12.0 - 15.0 g/dL   HCT 36.4 36.0 - 46.0 %   MCV 94.8 80.0 - 100.0 fL   MCH 33.3 26.0 - 34.0 pg   MCHC 35.2 30.0 - 36.0 g/dL   RDW 12.9 11.5 - 15.5 %   Platelets 199 150 - 400 K/uL   nRBC 0.0 0.0 - 0.2 %    Comment: Performed at [facility], [address] [place], [place] [address]     Assessment:   29 y.o. G2P1011 postpartum day #  1-stable   Plan:  1) Continue postpartum care and support breastfeeding  2) A POS/ RNI/VI-offer MMR on discharge  3) TDAP -given AP  4) Breast/ Contraception-unsure  5) Disposition-plan discharge tomorrow  Dalia Heading, CNM

## 2019-05-29 NOTE — Lactation Note (Signed)
This note was copied from a baby's chart. Lactation Consultation Note  Patient Name: Tina Fischer [id number] Date: 05/29/2019 Reason for consult: Follow-up assessment  LC attempted to assist with feed one more time before leaving. Baby at stool diaper which dad changed. Baby placed at breast, multiple attempts to latch. Baby did have a burp and gagging episode while attempting to latch. Baby still uninterested at the breast. LC performed finger feed with almost 31mL expressed milk before baby began to gag with mucous coming up, medium amount. Mom plans to continue efforts of putting baby to the breast, followed by hand expression and syring/spoon feeding if needed overnight.  Maternal Data Formula Feeding for Exclusion: No Has patient been taught Hand Expression?: Yes Does the patient have breastfeeding experience prior to this delivery?: No  Feeding Feeding Type: Breast Fed  LATCH Score Latch: Repeated attempts needed to sustain latch, nipple held in mouth throughout feeding, stimulation needed to elicit sucking reflex.  Audible Swallowing: None  Type of Nipple: Flat  Comfort (Breast/Nipple): Soft / non-tender  Hold (Positioning): Assistance needed to correctly position infant at breast and maintain latch.  LATCH Score: 5  Interventions Interventions: Breast feeding basics reviewed;Expressed milk;Reverse pressure;Adjust position;Support pillows;Assisted with latch;Skin to skin  Lactation Tools Discussed/Used Tools: Nipple Shields Nipple shield size: 20 WIC Program: No   Consult Status Consult Status: Follow-up Date: 05/30/19 Follow-up type: In-patient    [name] 05/29/2019, 5:12 PM

## 2019-05-30 MED ORDER — IBUPROFEN 600 MG PO TABS: 600.0000 mg | ORAL_TABLET | Freq: Four times a day (QID) | ORAL | 0 refills | Status: DC

## 2019-05-30 MED ORDER — DOCUSATE SODIUM 100 MG PO CAPS: 100.0000 mg | ORAL_CAPSULE | Freq: Two times a day (BID) | ORAL | 11 refills | Status: AC | PRN

## 2019-05-30 MED ORDER — ACETAMINOPHEN 500 MG PO TABS: 1000.0000 mg | ORAL_TABLET | Freq: Four times a day (QID) | ORAL | 0 refills | Status: DC | PRN

## 2019-05-30 NOTE — Progress Notes (Signed)
Patient ID: Tina Fischer, female   DOB: 1990/09/29, 29 y.o.   MRN: [id number]   Subjective:  She is doing well. Comfortable in bed. Pain control is adequate. Voiding without difficulty. Tolerating a regular diet. Ambulating well.  Objective:   Blood pressure 104/65, pulse 64, temperature 98.5 F (36.9 C), temperature source Oral, resp. rate 18, height 5\' 4"  (1.626 m), weight 78.5 kg, last menstrual period 08/26/2018, SpO2 96 %, unknown if currently breastfeeding.  General: NAD Pulmonary: no increased work of breathing Abdomen: non-distended, non-tender Uterus:  fundus firm at U; lochia normal Extremities: no edema, no erythema, no tenderness, no signs of DVT  Results for orders placed or performed during the hospital encounter of 05/28/19 (from the past 72 hour(s))  CBC     Status: Abnormal   Collection Time: 05/28/19  8:27 AM  Result Value Ref Range   WBC 10.6 (H) 4.0 - 10.5 K/uL   RBC 3.94 3.87 - 5.11 MIL/uL   Hemoglobin 12.8 12.0 - 15.0 g/dL   HCT 37.1 36.0 - 46.0 %   MCV 94.2 80.0 - 100.0 fL   MCH 32.5 26.0 - 34.0 pg   MCHC 34.5 30.0 - 36.0 g/dL   RDW 12.7 11.5 - 15.5 %   Platelets 236 150 - 400 K/uL   nRBC 0.0 0.0 - 0.2 %    Comment: Performed at [facility], [address] [place], [place] [address]  RPR     Status: None   Collection Time: 05/28/19  8:27 AM  Result Value Ref Range   RPR Ser Ql NON REACTIVE NON REACTIVE    Comment: Performed at [facility], [address], [place], [place] [address]  Type and screen     Status: None   Collection Time: 05/28/19  8:27 AM  Result Value Ref Range   ABO/RH(D) A POS    Antibody Screen NEG    Sample Expiration      05/31/2019,2359 Performed at [facility], [address] [place], [place] [address]   CBC     Status: Abnormal   Collection Time: 05/29/19  5:20 AM  Result Value Ref Range   WBC 15.8 (H) 4.0 - 10.5 K/uL   RBC 3.84 (L) 3.87 - 5.11 MIL/uL   Hemoglobin 12.8 12.0 - 15.0 g/dL   HCT 36.4 36.0 - 46.0 %   MCV 94.8 80.0 - 100.0 fL   MCH 33.3 26.0 - 34.0 pg   MCHC 35.2 30.0 - 36.0 g/dL   RDW 12.9 11.5 - 15.5 %   Platelets 199 150 - 400 K/uL   nRBC 0.0 0.0 - 0.2 %    Comment: Performed at [facility], [address] [place], [place] [address]    Assessment:   29 y.o. G2P1011 postpartum day # 2  Plan:    1) Blood Type --/--/A POS (02/23 0827) /   2) Rubella <0.90 (08/14 1641) - nonimmune, offered immunization Varicella Immune TDAP up to date   3) Breast feeding- pumping and syringe feeding  4) Contraception- none decided  5) Discharge anticipated today.  Adrian Prows MD [facility] 05/30/2019 9:05 AM

## 2019-05-30 NOTE — Progress Notes (Signed)
Pt discharged with infant. Discharge instructions, prescriptions, and follow up appointments given to and reviewed with patient. Pt verbalized understanding. Escorted out by auxillary.  

## 2019-05-30 NOTE — Lactation Note (Signed)
This note was copied from a baby's chart. Lactation Consultation Note  Patient Name: Tina Fischer [id number] Date: 05/30/2019 Reason for consult: Follow-up assessment;Difficult latch;1st time breastfeeding;Primapara;Term  Mom in to see parents and baby Tina Fischer. Parents syringe fed small amounts of expressed breast milk, 7-92mL throughout the night, mom pumping routinely with her personal DEBP (medela). Baby has elevated bilirubin levels, with a planned re-draw at 1pm. Pediatrician would like parents to continue working on feedings at the breast and giving expressed milk if needed throughout the day with a feeding plan before discharge, and pending lower bilirubin levels.  First assistance with feeding, LC placed some EBM into nipple shield with baby in football hold on right breast, baby brought nose to nipple, little effort made by baby to open mouth wide, LC used EBM in curved tip syringe and placed in corner of baby's mouth to encourage feeding. Baby did open mouth but only took in the tip of the nipple shield. After several minutes, LC recommended repositioning of baby into cross-cradle, onto right breast. Again EBM placed into shield, baby did give a wider mouth and opened and accepted nipple shield deeply and had a sucking pattern of approximately 5 minutes before laying contently.  Second attempt at feeding at the breast 20-25 minutes later on opposite breast- EBM placed in nipple shield, baby placed in football hold, per preference of mom, and brought to breast. Baby not reactive at the breast, lying contently, non-cuing.  LC did suggest feeding of EBM of approximately 23-28mL via bottle that was warmed up. LC also discussed with parents feeding plan for today to include working with baby at the breast, followed by expression via EBP, and paced bottle feeding of EBM. [place] educated dad on paced-bottle feeding technique, and observed baby's feeding via bottle. The first 3 attempts with bottle in  mouth appeared shallow and forced, baby finished the feeding taking the bottle well, flanged top/bottom lips, and tolerating paced-bottle feeding technique. Baby consumed all 23-58mL of EBM. Mom did express and stimulation breasts via personal EBP while dad gave bottle. Mom is confident with feeding plan of offering breasts, pumping, and giving of EBM.   Maternal Data Formula Feeding for Exclusion: No Has patient been taught Hand Expression?: Yes Does the patient have breastfeeding experience prior to this delivery?: No  Feeding Feeding Type: Bottle Fed - Breast Milk  LATCH Score Latch: Too sleepy or reluctant, no latch achieved, no sucking elicited.  Audible Swallowing: None  Type of Nipple: Flat  Comfort (Breast/Nipple): Soft / non-tender  Hold (Positioning): Assistance needed to correctly position infant at breast and maintain latch.  LATCH Score: 4  Interventions Interventions: Breast feeding basics reviewed;Assisted with latch;Adjust position;Support pillows;Expressed milk;DEBP  Lactation Tools Discussed/Used Tools: Nipple [place] Fuel;Bottle Nipple shield size: 24   Consult Status Consult Status: Follow-up Date: 05/30/19 Follow-up type: In-patient    Tina Fischer 05/30/2019, 11:21 AM

## 2019-05-30 NOTE — Lactation Note (Signed)
This note was copied from a baby's chart. Lactation Consultation Note  Patient Name: Tina Fischer [id number] Date: 05/30/2019 Reason for consult: Follow-up assessment;Mother's request;Primapara;1st time breastfeeding;Term  Dad called out to notify [place] of feeding before bilirubin draw scheduled at 1pm. When LC entered room parents had decided to give EBM via bottle instead of efforts towards putting baby to breast. Mom felt that it was important to ensure baby's intake instead of latching at this time.  LC encouraged putting baby to breast prior to bottle but respected parents wishes; walked mom through paced-bottle feeding with dad beside to assist as well. Baby consumed total of 33mL at this feeding.  LC gauged parents desire for discharge through conversation. Mom feels ready and confident to go home. Mom plans to continue efforts of putting baby to the breast to encourage breast acceptance with use of nipple shield, possibly adding EBM into shield for encouragement. She plans to continue pumping routine of every 2-3 hours and follow at breast feeding attempts with bottle, paced-bottle feeding, to infants satisfaction. Both parents report having strong support system at home and multiple women with breastfeeding experience.  LC encouraged efforts, with routine reiterated: -Attempt a feeding at the breast (latch and sucking, use of EBM into shield if needed) -Pump every 2-3 hours -EBM given via bottle to infants satisfaction using Paced-bottle feeding. Baby has pediatrician appointment tomorrow along with visit with lactation consultant at pediatricians office. Information given for outpatient lactation services through [facility], and community breastfeeding resources. Encouraged to call out with questions or for assistance today before discharge if needed. Maternal Data Formula Feeding for Exclusion: No Has patient been taught Hand Expression?: Yes Does the patient have breastfeeding experience  prior to this delivery?: No  Feeding Feeding Type: Bottle Fed - Breast Milk Nipple Type: Slow - flow  LATCH Score                   Interventions Interventions: Breast feeding basics reviewed;DEBP  Lactation Tools Discussed/Used     Consult Status Consult Status: Complete Date: 05/30/19 Follow-up type: Call as needed    [name] 05/30/2019, 2:27 PM

## 2019-06-20 ENCOUNTER — Telehealth: Payer: Medicaid Other | Admitting: Family

## 2019-06-20 DIAGNOSIS — N61 Mastitis without abscess: Secondary | ICD-10-CM | POA: Diagnosis not present

## 2019-06-20 MED ORDER — CEPHALEXIN 500 MG PO CAPS: 500.0000 mg | ORAL_CAPSULE | Freq: Four times a day (QID) | ORAL | 0 refills | Status: DC

## 2019-06-20 NOTE — Progress Notes (Signed)
E Visit for Cellulitis  We are sorry that you are not feeling well. Here is how we plan to help!  Based on what you shared with me it looks like you have cellulitis.  Cellulitis looks like areas of skin redness, swelling, and warmth; it develops as a result of bacteria entering under the skin. Little red spots and/or bleeding can be seen in skin, and tiny surface sacs containing fluid can occur. Fever can be present. Cellulitis is almost always on one side of a body, and the lower limbs are the most common site of involvement.   I have prescribed:  Keflex 500mg take one by mouth four times a day for 5 days  HOME CARE:  . Take your medications as ordered and take all of them, even if the skin irritation appears to be healing.   GET HELP RIGHT AWAY IF:  . Symptoms that don't begin to go away within 48 hours. . Severe redness persists or worsens . If the area turns color, spreads or swells. . If it blisters and opens, develops yellow-brown crust or bleeds. . You develop a fever or chills. . If the pain increases or becomes unbearable.  . Are unable to keep fluids and food down.  MAKE SURE YOU    Understand these instructions.  Will watch your condition.  Will get help right away if you are not doing well or get worse.  Thank you for choosing an e-visit. Your e-visit answers were reviewed by a board certified advanced clinical practitioner to complete your personal care plan. Depending upon the condition, your plan could have included both over the counter or prescription medications. Please review your pharmacy choice. Make sure the pharmacy is open so you can pick up prescription now. If there is a problem, you may contact your provider through MyChart messaging and have the prescription routed to another pharmacy. Your safety is important to us. If you have drug allergies check your prescription carefully.  For the next 24 hours you can use MyChart to ask questions about today's  visit, request a non-urgent call back, or ask for a work or school excuse. You will get an email in the next two days asking about your experience. I hope that your e-visit has been valuable and will speed your recovery.  Greater than 5 minutes, yet less than 10 minutes of time have been spent researching, coordinating, and implementing care for this patient today.  Thank you for the details you included in the comment boxes. Those details are very helpful in determining the best course of treatment for you and help us to provide the best care.  

## 2019-07-10 ENCOUNTER — Other Ambulatory Visit (HOSPITAL_COMMUNITY)
Admission: RE | Admit: 2019-07-10 | Discharge: 2019-07-10 | Disposition: A | Discharge status: Home / Self Care | Payer: Medicaid Other | Source: Ambulatory Visit | Attending: Obstetrics and Gynecology | Admitting: Obstetrics and Gynecology

## 2019-07-10 ENCOUNTER — Other Ambulatory Visit: Payer: Self-pay

## 2019-07-10 ENCOUNTER — Ambulatory Visit (INDEPENDENT_AMBULATORY_CARE_PROVIDER_SITE_OTHER): Payer: Medicaid Other | Admitting: Obstetrics and Gynecology

## 2019-07-10 ENCOUNTER — Encounter: Payer: Self-pay | Admitting: Obstetrics and Gynecology

## 2019-07-10 DIAGNOSIS — Z124 Encounter for screening for malignant neoplasm of cervix: Secondary | ICD-10-CM | POA: Diagnosis not present

## 2019-07-10 NOTE — Progress Notes (Signed)
Postpartum Visit  Chief Complaint:  Chief Complaint  Patient presents with  . Postpartum Care    History of Present Illness: Patient is a 29 y.o. G2P1011 presents for postpartum visit.  Date of delivery: 05/28/2019 Type of delivery: Vaginal Episiotomy No.  Laceration: 2nd degree Pregnancy or labor problems:  no Any problems since the delivery:  Mastitis, treated with abx.  Resolved now.   Newborn Details:  SINGLETON :  1. Baby's name: [name]. Birth weight: 6.9lb  (2,980 grams) Maternal Details:  Breast Feeding:  YES Post partum depression/anxiety noted:  no Edinburgh Post-Partum Depression Score:  1  Date of last PAP: 11/16/2017/NORMAL  Past Medical History:  Diagnosis Date  . Allergy   . Asthma   . Frequent headaches   . Lipoma   . Migraines     Past Surgical History:  Procedure Laterality Date  . TONSILLECTOMY AND ADENOIDECTOMY  2008  . WISDOM TOOTH EXTRACTION  2017    Prior to Admission medications   Medication Sig Start Date End Date Taking? Authorizing Provider  acetaminophen (TYLENOL) 500 MG tablet Take 2 tablets (1,000 mg total) by mouth every 6 (six) hours as needed for mild pain. 05/30/19   Schuman, Stefanie Libel, MD  albuterol (VENTOLIN HFA) 108 (90 Base) MCG/ACT inhaler Inhale 2 puffs into the lungs every 6 (six) hours as needed for wheezing or shortness of breath. 04/30/19   Pleas Koch, NP  cephALEXin (KEFLEX) 500 MG capsule Take 1 capsule (500 mg total) by mouth 4 (four) times daily. 06/20/19   Kennyth Arnold, FNP  cetirizine (ZYRTEC) 10 MG tablet Take by mouth.    [provider]  docusate sodium (COLACE) 100 MG capsule Take 1 capsule (100 mg total) by mouth 2 (two) times daily as needed. 05/30/19 05/29/20  Homero Fellers, MD  famotidine-calcium carbonate-magnesium hydroxide (PEPCID COMPLETE) 10-800-165 MG chewable tablet Chew 1 tablet by mouth daily as needed.    [provider]  ibuprofen (ADVIL) 600 MG tablet Take 1 tablet  (600 mg total) by mouth every 6 (six) hours. 05/30/19   Schuman, Christanna R, MD  montelukast (SINGULAIR) 10 MG tablet Take 1 tablet (10 mg total) by mouth at bedtime. For asthma/allergies. 04/30/19   Pleas Koch, NP  Prenat-FeFum-FePo-FA-Omega 3 (CONCEPT DHA) 53.5-38-1 MG CAPS Take 1 capsule by mouth daily.    [provider]    No Known Allergies   Social History   Socioeconomic History  . Marital status: Married    Spouse name: Not on file  . Number of children: Not on file  . Years of education: Not on file  . Highest education level: Not on file  Occupational History  . Occupation: Unemployed  . Occupation: Airline pilot  Tobacco Use  . Smoking status: Former Smoker    Types: Cigarettes    Quit date: 05/23/2018    Years since quitting: 1.1  . Smokeless tobacco: Never Used  Substance and Sexual Activity  . Alcohol use: Yes    Comment: Occ  . Drug use: Yes    Types: Marijuana    Comment: Stopped with current pregnancy 2020  . Sexual activity: Yes    Birth control/protection: None  Other Topics Concern  . Not on file  Social History Narrative   Married.   No children.   Works for [employer], yoga, Chiropodist.    Social Determinants of Health   Financial Resource Strain:   . Difficulty of Paying  Living Expenses:   Food Insecurity:   . Worried About Charity fundraiser in the Last Year:   . Arboriculturist in the Last Year:   Transportation Needs:   . Film/video editor (Medical):   Marland Kitchen Lack of Transportation (Non-Medical):   Physical Activity:   . Days of Exercise per Week:   . Minutes of Exercise per Session:   Stress:   . Feeling of Stress :   Social Connections:   . Frequency of Communication with Friends and Family:   . Frequency of Social Gatherings with Friends and Family:   . Attends Religious Services:   . Active Member of Clubs or Organizations:   . Attends Archivist Meetings:   Marland Kitchen  Marital Status:   Intimate Partner Violence:   . Fear of Current or Ex-Partner:   . Emotionally Abused:   Marland Kitchen Physically Abused:   . Sexually Abused:     Family History  Problem Relation Age of Onset  . Cancer Maternal Grandmother 16       breast  . Early death Maternal Grandmother   . Early death Maternal Grandfather   . Depression Paternal Grandmother   . Depression Paternal Grandfather   . Cancer Paternal Grandfather 44       Prostated    Review of Systems  Constitutional: Negative.   HENT: Negative.   Eyes: Negative.   Respiratory: Negative.   Cardiovascular: Negative.   Gastrointestinal: Negative.   Genitourinary: Negative.   Musculoskeletal: Negative.   Skin: Negative.   Neurological: Negative.   Psychiatric/Behavioral: Negative.      Physical Exam BP 118/74   Wt 151 lb (68.5 kg)   BMI 25.92 kg/m   Physical Exam Constitutional:      General: She is not in acute distress.    Appearance: Normal appearance. She is well-developed.  Genitourinary:     Pelvic exam was performed with patient in the lithotomy position.     Vulva, inguinal canal, urethra, bladder, vagina, uterus, right adnexa and left adnexa normal.     No posterior fourchette tenderness, injury or lesion present.     No cervical friability, lesion, bleeding or polyp.  HENT:     Head: Normocephalic and atraumatic.  Eyes:     General: No scleral icterus.    Conjunctiva/sclera: Conjunctivae normal.  Cardiovascular:     Rate and Rhythm: Normal rate and regular rhythm.     Heart sounds: No murmur. No friction rub. No gallop.   Pulmonary:     Effort: Pulmonary effort is normal. No respiratory distress.     Breath sounds: Normal breath sounds. No wheezing or rales.  Abdominal:     General: Bowel sounds are normal. There is no distension.     Palpations: Abdomen is soft. There is no mass.     Tenderness: There is no abdominal tenderness. There is no guarding or rebound.  Musculoskeletal:         General: Normal range of motion.     Cervical back: Normal range of motion and neck supple.  Neurological:     General: No focal deficit present.     Mental Status: She is alert and oriented to person, place, and time.     Cranial Nerves: No cranial nerve deficit.  Skin:    General: Skin is warm and dry.     Findings: No erythema.  Psychiatric:        Mood and Affect: Mood normal.  Behavior: Behavior normal.        Judgment: Judgment normal.      Female Chaperone present during breast and/or pelvic exam.  Assessment: 29 y.o. [id number] presenting for 6 week postpartum visit  Plan: Problem List Items Addressed This Visit    None    Visit Diagnoses    Postpartum care and examination    -  Primary   Relevant Orders   Cytology - PAP   Pap smear for cervical cancer screening       Relevant Orders   Cytology - PAP       1) Contraception: Does not want to utilize contraception. Discussed interpregnancy spacing.  2)  Pap - ASCCP guidelines and rational discussed.  Patient opts for routine screening interval  3) Patient underwent screening for postpartum depression with no concerns noted.  4) Follow up 1 year for routine annual exam  Prentice Docker, MD 07/10/2019 2:10 PM

## 2019-07-15 LAB — CYTOLOGY - PAP
Comment: NEGATIVE
Diagnosis: NEGATIVE
High risk HPV: NEGATIVE

## 2020-04-09 ENCOUNTER — Telehealth: Payer: Self-pay

## 2020-04-09 NOTE — Telephone Encounter (Signed)
[facility] Day - Client TELEPHONE ADVICE RECORD Access[facility]urse Patient Name: Tina Fischer Gender: Female DOB: Nov 03, 1990 Age: 30 Y 3 M 13 D Return Phone Number: [phone] (Primary) Address: City/State/Zip: [place] [address] Client [facility] Day - Client Client Site [facility] - Day Physician Vernona Rieger - NP Contact Type Call Who Is Calling Patient / Member / Family / Caregiver Call Type Triage / Clinical Relationship To Patient Self Return Phone Number [phone] (Primary) Chief Complaint Headache Reason for Call Symptomatic / Request for Health Information Initial Comment Transferred from office, PT has very sore throat and its getting worse. PT thinks she has strep, headaces and a cough with green phlegm and has spots on her throat. PT had negative Covid test. Translation No Nurse Assessment Nurse: Stefano Gaul, RN, Dwana Curd Date/Time (Eastern Time): 04/09/2020 1:54:47 PM Confirm and document reason for call. If symptomatic, describe symptoms. ---Caller states she has a sore throat which is getting worse. Symptoms started on Sunday. has a headache. Spots on the back of her throat. COVID test is negative. no fever. She is drinking fluids. Does the patient have any new or worsening symptoms? ---Yes Will a triage be completed? ---Yes Related visit to physician within the last 2 weeks? ---No Does the PT have any chronic conditions? (i.e. diabetes, asthma, this includes High risk factors for pregnancy, etc.) ---Yes List chronic conditions. ---asthma Is the patient pregnant or possibly pregnant? (Ask all females between the ages of 28-55) ---No Is this a behavioral health or substance abuse call? ---No Guidelines Guideline Title Affirmed Question Affirmed Notes Nurse Date/Time Lamount Cohen Time) Sore Throat SEVERE (e.g., excruciating) throat pain Stefano Gaul, RN, Dwana Curd 04/09/2020 1:57:48 PM Disp. Time  Lamount Cohen Time) Disposition Final User 04/09/2020 2:01:13 PM See PCP within 24 Hours Yes Stefano Gaul, RN, Dwana Curd PLEASE NOTE: All timestamps contained within this report are represented as [place] Standard Time. CONFIDENTIALTY NOTICE: This fax transmission is intended only for the addressee. It contains information that is legally privileged, confidential or otherwise protected from use or disclosure. If you are not the intended recipient, you are strictly prohibited from reviewing, disclosing, copying using or disseminating any of this information or taking any action in reliance on or regarding this information. If you have received this fax in error, please notify us immediately by telephone so that we can arrange for its return to [place]. Phone: [phone], Toll-Free: [phone], Fax: [phone] Page: 2 of 2 Call Id: [id number] Caller Disagree/Comply Comply Caller Understands Yes PreDisposition Call Doctor Care Advice Given Per Guideline SEE PCP WITHIN 24 HOURS: * IF OFFICE WILL BE OPEN: You need to be examined within the next 24 hours. Call your doctor (or NP/PA) when the office opens and make an appointment. SORE THROAT: * Here are some simple things you can do to treat and reduce sore throat pain. * Sip warm chicken broth or apple juice. * Suck on hard candy or an over-the-counter throat lozenge. * Gargle with warm salt water four times a day. To make salt water, put 1/2 teaspoon of salt in 8 oz (240 ml) of warm water. PAIN AND FEVER MEDICINES: * For pain or fever relief, take either acetaminophen or ibuprofen. DRINK PLENTY LIQUIDS: * Drink plenty of liquids. This is important to prevent dehydration. CALL BACK IF: * You become worse CARE ADVICE given per Sore Throat (Adult) guideline. Comments User: Art Buff, RN Date/Time Lamount Cohen Time): 04/09/2020 2:04:26 PM transferred to the office for appt Referrals REFERRED TO  PCP OFFICE

## 2020-04-09 NOTE — Telephone Encounter (Signed)
Will see her then 

## 2020-04-09 NOTE — Telephone Encounter (Signed)
Pt already has appt with DR Tower on 0/07/22. FYI to Dr Milinda Antis.

## 2020-04-10 ENCOUNTER — Other Ambulatory Visit: Payer: Medicaid Other

## 2020-04-10 ENCOUNTER — Other Ambulatory Visit (INDEPENDENT_AMBULATORY_CARE_PROVIDER_SITE_OTHER): Payer: Medicaid Other | Admitting: Family Medicine

## 2020-04-10 ENCOUNTER — Telehealth: Payer: Self-pay

## 2020-04-10 ENCOUNTER — Other Ambulatory Visit: Payer: Self-pay

## 2020-04-10 ENCOUNTER — Encounter: Payer: Self-pay | Admitting: Family Medicine

## 2020-04-10 ENCOUNTER — Telehealth (INDEPENDENT_AMBULATORY_CARE_PROVIDER_SITE_OTHER): Payer: Medicaid Other | Admitting: Family Medicine

## 2020-04-10 VITALS — Temp 99.0°F | Wt 133.3 lb

## 2020-04-10 DIAGNOSIS — J069 Acute upper respiratory infection, unspecified: Secondary | ICD-10-CM | POA: Diagnosis not present

## 2020-04-10 DIAGNOSIS — J029 Acute pharyngitis, unspecified: Secondary | ICD-10-CM | POA: Insufficient documentation

## 2020-04-10 LAB — POCT RAPID STREP A (OFFICE): Rapid Strep A Screen: NEGATIVE

## 2020-04-10 LAB — POC INFLUENZA A&B (BINAX/QUICKVUE)
Influenza A, POC: NEGATIVE
Influenza B, POC: NEGATIVE

## 2020-04-10 NOTE — Telephone Encounter (Signed)
Contacted pt and scheduled for 4pm for strep and flu. Pt verbalized understanding.

## 2020-04-10 NOTE — Assessment & Plan Note (Signed)
Pt does also have some uri symptoms but ST is the worst symptom Req strep swab  Ordered for drive through  Will update with result  Disc symptom control  If severe/unable to swallow rec ER

## 2020-04-10 NOTE — Assessment & Plan Note (Signed)
Since Sunday with neg covid testing  Not imm for covid or flu  Has had low grade temp  Worst symptom is ST Rev symptom control and ER parameters  Ordered flu and strep swabs to be done drive through Will update with results  inst to call if symptoms worsen in the meantime

## 2020-04-10 NOTE — Patient Instructions (Signed)
Drink fluids  Gargling with salt water may help  Continue your over the counter medicines if they help  The office will call you to set up flu and strep swabs  Alert [place] of any changes  If severe or you cannot swallow -go to the ER

## 2020-04-10 NOTE — Progress Notes (Signed)
Virtual Visit via Video Note  I connected with Tina Fischer on 04/10/20 at  3:00 PM EST by a video enabled telemedicine application and verified that I am speaking with the correct person using two identifiers.  Location: Patient: home Provider: office   I discussed the limitations of evaluation and management by telemedicine and the availability of in person appointments. The patient expressed understanding and agreed to proceed.  Parties involved in encounter  Patient: Tina Fischer   Provider:  Loura Pardon MD   Video failed today so visit acc by phone   History of Present Illness: 30 yo pt of NP Clark presents with cough and headache with sore throat  She has a h/o asthma and allergies    Per phone call yesterday noted she had a negative covid test and was worried about strep throat  Symptoms started on Sunday  Felt like she had cold symptoms  Then Thursday had a very bad ST (covid test was neg at work)   [place] got better then worse again  Temp 99 yesterday  Cough on and off (not bad)  Some nasal congestion  Not very hoarse  Had a headache all day yesterday and a little today   Throat looks red with some red spots  No exudate that she can see   No rash   occ green phlegm from her throat   Has had tonsillectomy  Wants to be tested for flu and strep throat  otc Sudafed  Elderberry syrup ST/cough med (combo) - has acetaminophen  Throat spray- generic chloraseptic  Lozenges  Good fluid intake   imm status  No flu or covid shots    Patient Active Problem List   Diagnosis Date Noted  . URI (upper respiratory infection) 04/10/2020  . Sore throat 04/10/2020  . Indication for care in labor and delivery, antepartum 05/28/2019  . Supervision of low-risk first pregnancy 10/10/2018  . Asthma during pregnancy 10/10/2018  . Lipoma 02/23/2018  . Mild intermittent asthma without complication 22/05/5425  . Allergic rhinitis due to allergen 11/16/2017  . ADHD  11/16/2017  . Migraines 11/16/2016   Past Medical History:  Diagnosis Date  . Allergy   . Asthma   . Frequent headaches   . Lipoma   . Migraines    Past Surgical History:  Procedure Laterality Date  . TONSILLECTOMY AND ADENOIDECTOMY  2008  . WISDOM TOOTH EXTRACTION  2017   Social History   Tobacco Use  . Smoking status: Former Smoker    Types: Cigarettes    Quit date: 05/23/2018    Years since quitting: 1.8  . Smokeless tobacco: Never Used  Vaping Use  . Vaping Use: Never used  Substance Use Topics  . Alcohol use: Yes    Comment: Occ  . Drug use: Yes    Types: Marijuana    Comment: Stopped with current pregnancy 2020   Family History  Problem Relation Age of Onset  . Cancer Maternal Grandmother 59       breast  . Early death Maternal Grandmother   . Early death Maternal Grandfather   . Depression Paternal Grandmother   . Depression Paternal Grandfather   . Cancer Paternal Grandfather 42       Prostated   No Known Allergies Current Outpatient Medications on File Prior to Visit  Medication Sig Dispense Refill  . acetaminophen (TYLENOL) 500 MG tablet Take 2 tablets (1,000 mg total) by mouth every 6 (six) hours as needed for mild pain. [place]  tablet 0  . albuterol (VENTOLIN HFA) 108 (90 Base) MCG/ACT inhaler Inhale 2 puffs into the lungs every 6 (six) hours as needed for wheezing or shortness of breath. 18 g 0  . cephALEXin (KEFLEX) 500 MG capsule Take 1 capsule (500 mg total) by mouth 4 (four) times daily. 28 capsule 0  . cetirizine (ZYRTEC) 10 MG tablet Take by mouth.    . docusate sodium (COLACE) 100 MG capsule Take 1 capsule (100 mg total) by mouth 2 (two) times daily as needed. 60 capsule 11  . famotidine-calcium carbonate-magnesium hydroxide (PEPCID COMPLETE) 10-800-165 MG chewable tablet Chew 1 tablet by mouth daily as needed.    Marland Kitchen ibuprofen (ADVIL) 600 MG tablet Take 1 tablet (600 mg total) by mouth every 6 (six) hours. 60 tablet 0  . montelukast (SINGULAIR) 10 MG  tablet Take 1 tablet (10 mg total) by mouth at bedtime. For asthma/allergies. 90 tablet 3  . Prenat-FeFum-FePo-FA-Omega 3 (CONCEPT DHA) 53.5-38-1 MG CAPS Take 1 capsule by mouth daily.     No current facility-administered medications on file prior to visit.   Review of Systems  Constitutional: Positive for fever and malaise/fatigue. Negative for chills.  HENT: Positive for congestion and sore throat. Negative for ear pain and sinus pain.   Eyes: Negative for blurred vision, discharge and redness.  Respiratory: Negative for cough, shortness of breath, wheezing and stridor.   Cardiovascular: Negative for chest pain, palpitations and leg swelling.  Gastrointestinal: Negative for abdominal pain, diarrhea, nausea and vomiting.  Musculoskeletal: Negative for myalgias.  Skin: Negative for rash.  Neurological: Positive for headaches. Negative for dizziness.    Observations/Objective: Pt sounds well, not distressed  Slightly hoarse voice  No wheeze or cough or sob heard during conversation  Nl cognition-good historian  Nl mood    Assessment and Plan: Problem List Items Addressed This Visit      Respiratory   URI (upper respiratory infection) - Primary    Since Sunday with neg covid testing  Not imm for covid or flu  Has had low grade temp  Worst symptom is ST Rev symptom control and ER parameters  Ordered flu and strep swabs to be done drive through Will update with results  inst to call if symptoms worsen in the meantime         Other   Sore throat    Pt does also have some uri symptoms but ST is the worst symptom Req strep swab  Ordered for drive through  Will update with result  Disc symptom control  If severe/unable to swallow rec ER         Follow Up Instructions: Drink fluids  Gargling with salt water may help  Continue your over the counter medicines if they help  The office will call you to set up flu and strep swabs  Alert [place] of any changes  If severe or you  cannot swallow -go to the ER     I discussed the assessment and treatment plan with the patient. The patient was provided an opportunity to ask questions and all were answered. The patient agreed with the plan and demonstrated an understanding of the instructions.   The patient was advised to call back or seek an in-person evaluation if the symptoms worsen or if the condition fails to improve as anticipated.  I provided 17 minutes of non-face-to-face time during this encounter.   Loura Pardon, MD

## 2020-06-16 ENCOUNTER — Telehealth: Payer: Self-pay | Admitting: Primary Care

## 2020-06-16 NOTE — Telephone Encounter (Signed)
Thank you! Was there anymore to this message or was it just a FYI?

## 2020-06-16 NOTE — Telephone Encounter (Signed)
Patient has joined Tobacco sensation program. EM

## 2020-06-22 NOTE — Telephone Encounter (Signed)
It was just an [place].  EM

## 2020-07-15 ENCOUNTER — Encounter: Payer: Medicaid Other | Admitting: Obstetrics and Gynecology

## 2020-07-16 ENCOUNTER — Encounter: Payer: Self-pay | Admitting: Obstetrics and Gynecology

## 2020-07-16 ENCOUNTER — Ambulatory Visit (INDEPENDENT_AMBULATORY_CARE_PROVIDER_SITE_OTHER): Payer: Medicaid Other | Admitting: Advanced Practice Midwife

## 2020-07-16 ENCOUNTER — Other Ambulatory Visit: Payer: Self-pay

## 2020-07-16 ENCOUNTER — Encounter: Payer: Self-pay | Admitting: Advanced Practice Midwife

## 2020-07-16 ENCOUNTER — Ambulatory Visit (INDEPENDENT_AMBULATORY_CARE_PROVIDER_SITE_OTHER): Payer: Medicaid Other | Admitting: Obstetrics and Gynecology

## 2020-07-16 VITALS — BP 122/74 | Wt 128.0 lb

## 2020-07-16 VITALS — BP 112/71 | Ht 64.0 in | Wt 129.0 lb

## 2020-07-16 DIAGNOSIS — O209 Hemorrhage in early pregnancy, unspecified: Secondary | ICD-10-CM

## 2020-07-16 DIAGNOSIS — Z3201 Encounter for pregnancy test, result positive: Secondary | ICD-10-CM

## 2020-07-16 DIAGNOSIS — O200 Threatened abortion: Secondary | ICD-10-CM

## 2020-07-16 DIAGNOSIS — O034 Incomplete spontaneous abortion without complication: Secondary | ICD-10-CM | POA: Diagnosis not present

## 2020-07-16 LAB — POCT URINE PREGNANCY: Preg Test, Ur: POSITIVE — AB

## 2020-07-16 NOTE — Progress Notes (Signed)
Obstetrics & Gynecology Office Visit    Chief Complaint  Patient presents with  . Vaginal Bleeding    Pt thins she is having a miscarriage d/t vaginal bleeding    History of Present Illness: 30 y.o. [id number] female at approximately 8-12 weeks (last menstrual period was 05/19/2020, but was very short and light). She had some spotting yesterday. She had much heavier bleeding today with some cramping. She was initially evaluated by a midwife this morning who thought she needed an ultrasound. That is why she presents today.  Since her appointment this morning she has continued to bleed heavily. She states that she is gushing blood and changing pads every 10 minutes.  She is still cramping in the center portion of her abdomen.   Past Medical History:  Diagnosis Date  . Allergy   . Asthma   . Frequent headaches   . Lipoma   . Migraines     Past Surgical History:  Procedure Laterality Date  . TONSILLECTOMY AND ADENOIDECTOMY  2008  . WISDOM TOOTH EXTRACTION  2017    Gynecologic History: Patient's last menstrual period was 04/16/2020.  Obstetric History: [id number]  Family History  Problem Relation Age of Onset  . Cancer Maternal Grandmother 7       breast  . Early death Maternal Grandmother   . Early death Maternal Grandfather   . Depression Paternal Grandmother   . Depression Paternal Grandfather   . Cancer Paternal Grandfather 2       Prostated    Social History   Socioeconomic History  . Marital status: Married    Spouse name: Not on file  . Number of children: Not on file  . Years of education: Not on file  . Highest education level: Not on file  Occupational History  . Occupation: Unemployed  . Occupation: Airline pilot  Tobacco Use  . Smoking status: Former Smoker    Types: Cigarettes    Quit date: 05/23/2018    Years since quitting: 2.1  . Smokeless tobacco: Never Used  Vaping Use  . Vaping Use: Never used  Substance and Sexual Activity  .  Alcohol use: Yes    Comment: Occ  . Drug use: Yes    Types: Marijuana    Comment: Stopped with current pregnancy 2020  . Sexual activity: Yes    Birth control/protection: None  Other Topics Concern  . Not on file  Social History Narrative   Married.   No children.   Works for [employer], yoga, Chiropodist.    Social Determinants of Health   Financial Resource Strain: Not on file  Food Insecurity: Not on file  Transportation Needs: Not on file  Physical Activity: Not on file  Stress: Not on file  Social Connections: Not on file  Intimate Partner Violence: Not on file    No Known Allergies  Prior to Admission medications   Denies    Review of Systems  Constitutional: Negative.   HENT: Negative.   Eyes: Negative.   Respiratory: Negative.   Cardiovascular: Negative.   Gastrointestinal: Negative.   Genitourinary: Negative.        See HPI  Musculoskeletal: Negative.   Skin: Negative.   Neurological: Negative.   Psychiatric/Behavioral: Negative.      Physical Exam BP 122/74   Wt 128 lb (58.1 kg)   LMP 04/16/2020   BMI 21.97 kg/m  Patient's last menstrual period was 04/16/2020. Physical Exam Constitutional:  General: She is not in acute distress.    Appearance: Normal appearance.  Genitourinary:     Bladder and urethral meatus normal.     No lesions in the vagina.     Right Labia: No rash, tenderness, lesions or skin changes.    Left Labia: No tenderness, lesions, skin changes or rash.    Vulva exam comments: Dried and fresh blood on perineum..     No inguinal adenopathy present in the right or left side.    Pelvic Tanner Score: 5/5.    No vaginal discharge, erythema, bleeding or ulceration.     No vaginal prolapse present.     Right Adnexa: not tender, not full and no mass present.    Left Adnexa: not tender, not full and no mass present.    Cervical exam comments: Os is open to about 1 cm, there is old and new blood in the  vagina. There is blood coming from the cervix.  Marland Kitchen   HENT:     Head: Normocephalic and atraumatic.  Eyes:     General: No scleral icterus.    Conjunctiva/sclera: Conjunctivae normal.  Lymphadenopathy:     Lower Body: No right inguinal adenopathy. No left inguinal adenopathy.  Neurological:     General: No focal deficit present.     Mental Status: She is alert and oriented to person, place, and time.     Cranial Nerves: No cranial nerve deficit.  Psychiatric:        Mood and Affect: Mood normal.        Behavior: Behavior normal.        Judgment: Judgment normal.    A ring forceps was utilized to try to tease out the pregnancy from the cervix.  See imaging note from today.  Some return of material was appreciated. Though it was difficult to tell whether this was products of conception, mucus, or blood.  Several passes were made and she tolerated the procedure well. Her cervix remained opened to 1 cm at the external os at the end of the procedure.   Female chaperone present for pelvic and breast  portions of the physical exam  Assessment: 30 y.o. [id number] female here for  1. Incomplete abortion      Plan: Problem List Items Addressed This Visit   None   Visit Diagnoses    Incomplete abortion    -  Primary     We discussed that given the ultrasound and exam findings that she was in the process of miscarrying. We discussed treatment options. Given how heavy her bleeding had been, I recommended going to the ER. She did not want to go to the ER and have a procedure at the hospital. I encouraged her to have a very low threshold to go to the ER if anything got worse or if the bleeding didn't let up in a couple of hours.  She declined misoprostol and surgery at this time.   A total of 28 minutes were spent face-to-face with the patient as well as preparation, review, communication, and documentation during this encounter.     Prentice Docker, MD 07/16/2020 6:05 PM

## 2020-07-16 NOTE — Progress Notes (Signed)
Patient ID: Tina Fischer, female   DOB: 11/02/1990, 30 y.o.   MRN: [id number]  Reason for Consult: Vaginal Bleeding (Positive pregnancy test )    Subjective:  HPI:  Tina Fischer is a 30 y.o. female being seen for vaginal bleeding that started yesterday as bright red and then lightened. This morning it was bright red again mostly spotting. She has had a left sided abdominal pain and general cramping that seems to be getting worse. She took a positive pregnancy test in March. She had a normal period in January and a 1 day bleed in February.   Past Medical History:  Diagnosis Date  . Allergy   . Asthma   . Frequent headaches   . Lipoma   . Migraines    Family History  Problem Relation Age of Onset  . Cancer Maternal Grandmother 83       breast  . Early death Maternal Grandmother   . Early death Maternal Grandfather   . Depression Paternal Grandmother   . Depression Paternal Grandfather   . Cancer Paternal Grandfather 40       Prostated   Past Surgical History:  Procedure Laterality Date  . TONSILLECTOMY AND ADENOIDECTOMY  2008  . WISDOM TOOTH EXTRACTION  2017    Short Social History:  Social History   Tobacco Use  . Smoking status: Former Smoker    Types: Cigarettes    Quit date: 05/23/2018    Years since quitting: 2.1  . Smokeless tobacco: Never Used  Substance Use Topics  . Alcohol use: Yes    Comment: Occ    No Known Allergies  No current outpatient medications on file.   No current facility-administered medications for this visit.    Review of Systems  Constitutional: Negative for chills and fever.  HENT: Negative for congestion, ear discharge, ear pain, hearing loss, sinus pain and sore throat.   Eyes: Negative for blurred vision and double vision.  Respiratory: Negative for cough, shortness of breath and wheezing.   Cardiovascular: Negative for chest pain, palpitations and leg swelling.  Gastrointestinal: Positive for abdominal pain. Negative  for blood in stool, constipation, diarrhea, heartburn, melena, nausea and vomiting.  Genitourinary: Negative for dysuria, flank pain, frequency, hematuria and urgency.  Musculoskeletal: Negative for back pain, joint pain and myalgias.  Skin: Negative for itching and rash.  Neurological: Negative for dizziness, tingling, tremors, sensory change, speech change, focal weakness, seizures, loss of consciousness, weakness and headaches.  Endo/Heme/Allergies: Negative for environmental allergies. Does not bruise/bleed easily.       Positive for vaginal bleeding  Psychiatric/Behavioral: Negative for depression, hallucinations, memory loss, substance abuse and suicidal ideas. The patient is not nervous/anxious and does not have insomnia.         Objective:  Objective   Vitals:   07/16/20 0931  BP: 112/71  Weight: 129 lb (58.5 kg)  Height: 5\' 4"  (1.626 m)   Body mass index is 22.14 kg/m. Constitutional: Well nourished, well developed female in no acute distress.  HEENT: normal Skin: Warm and dry.    Extremity: no edema Respiratory:  Normal respiratory effort Neuro: DTRs 2+, Cranial nerves grossly intact Psych: Alert and Oriented x3. No memory deficits. Normal mood and affect.  MS: normal gait, normal bilateral lower extremity ROM/strength/stability.  Pelvic exam:  is not limited by body habitus EGBUS: within normal limits Vagina: within normal limits and with normal mucosa, large pooling of blood in the vault with small clots present Cervix: unable to  visualize due to bleeding   Data: UPT positive  Assessment/Plan:     30 y.o. G57 P1021 female with vaginal bleeding and threatened miscarriage in early pregnancy  Serial Beta HCG: today and Saturday Go to [facility] for BS vaginal ultrasound with MD Follow up as needed   [facility] 07/16/2020, 4:26 PM

## 2020-07-16 NOTE — Progress Notes (Signed)
ULTRASOUND REPORT  Location: [facility] Date of Service: 07/16/2020   Indications:Threatened AB Findings:  Uterus with no obvious intrauterine gestation. There appeared to be blood and products of conception in the lower uterine segment and proximal cervix.  No adnexal lesions noted.    FHR: n/a  CRL measurement: n/a. No embryo visualized  Yolk sac is not visualized. Amnion: not visualized   Right Ovary is normal in appearance. Left Ovary is normal appearance. Corpus luteal cyst:  Right ovary Survey of the adnexa demonstrates no adnexal masses. There is no free peritoneal fluid in the cul de sac.  Impression: 1. Incomplete spontaneous abortion with some products of conception versus blood in the lower uterine segment and proximal cervix  Recommendations: 1.Clinical correlation with the patient's History and Physical Exam.   Prentice Docker, MD  I personally performed and interpreted the ultrasound.  There was a female chaperone present during the entire transvaginal ultrasound.  Prentice Docker, MD, [facility], [facility] 07/16/2020 6:11 PM

## 2020-07-17 LAB — BETA HCG QUANT (REF LAB): hCG Quant: 6110 m[IU]/mL

## 2020-07-24 ENCOUNTER — Encounter: Payer: Medicaid Other | Admitting: Obstetrics and Gynecology

## 2020-09-22 ENCOUNTER — Telehealth: Payer: Self-pay | Admitting: *Deleted

## 2020-09-22 NOTE — Telephone Encounter (Signed)
[facility] with [facility] left a voicemail stating that she wanted to let Allie Bossier NP know that patient is no longer enrolled in their Tobacco Sensation Program.

## 2020-09-22 NOTE — Telephone Encounter (Signed)
Noted  

## 2020-09-24 ENCOUNTER — Other Ambulatory Visit: Payer: Self-pay | Admitting: Primary Care

## 2020-09-24 DIAGNOSIS — J3089 Other allergic rhinitis: Secondary | ICD-10-CM

## 2020-09-24 DIAGNOSIS — J4520 Mild intermittent asthma, uncomplicated: Secondary | ICD-10-CM

## 2020-10-22 ENCOUNTER — Encounter: Payer: Self-pay | Admitting: Primary Care

## 2020-10-22 ENCOUNTER — Ambulatory Visit: Payer: Medicaid Other | Admitting: Primary Care

## 2020-10-22 ENCOUNTER — Other Ambulatory Visit: Payer: Self-pay

## 2020-10-22 VITALS — BP 110/68 | HR 80 | Temp 97.8°F | Ht 64.0 in | Wt 128.0 lb

## 2020-10-22 DIAGNOSIS — R21 Rash and other nonspecific skin eruption: Secondary | ICD-10-CM | POA: Diagnosis not present

## 2020-10-22 MED ORDER — PREDNISONE 20 MG PO TABS: ORAL_TABLET | ORAL | 0 refills | Status: DC

## 2020-10-22 MED ORDER — MONTELUKAST SODIUM 10 MG PO TABS: 10.0000 mg | ORAL_TABLET | Freq: Every day | ORAL | 0 refills | Status: DC

## 2020-10-22 NOTE — Assessment & Plan Note (Signed)
Acute for 6-7 weeks, waxes and wanes.  Doesn't really meet the characteristics of shingles, especially with the waxing and waning effects.   Will trial oral prednisone course. If no improvement then consider higher potency steroid cream or antiviral treatment. She will update.

## 2020-10-22 NOTE — Patient Instructions (Signed)
Start prednisone 20 mg tablets for the rash.  Take 3 tablets by mouth once daily for two days, then 2 tablets for three days, then 1 tablet for three days.   Continue Zyrtec and Singulair.   Please update me if no improvement in 3-4 days or if the rash returns.   It was a pleasure to see you today!

## 2020-10-22 NOTE — Progress Notes (Signed)
Subjective:    Patient ID: Tina Fischer, female    DOB: 12/03/1990, 30 y.o.   MRN: [id number]  HPI  Tina Fischer is a very pleasant 30 y.o. female with a history of asthma who presents today to discuss rash.   Intermittent for the last 6-7 weeks, with redness, itching, burning. Waxes and wanes every four days. Located to the posterior trunk. No one else in her home has this rash. She is also needing a refill of her Singulair.   No new lotions, detergents, soaps or shampoos. No new medicines, vitamins, supplements. No new pets. No recent outdoor exposure or poison ivy exposure. No bonfire or smoke exposure.  No recent motel or hotel stay or new beds.   No fevers/chills, oral lesions, new joint pains, tick bites, abdominal pain, nausea.   She takes Singulair nightly, Zyrtec daily. She's tried petroleum jelly, her daughters eczema cream (unknown) without improvement.    Review of Systems  Respiratory:  Negative for shortness of breath and wheezing.   Skin:  Positive for rash.        Past Medical History:  Diagnosis Date   Allergy    Asthma    Frequent headaches    Lipoma    Migraines     Social History   Socioeconomic History   Marital status: Married    Spouse name: Not on file   Number of children: Not on file   Years of education: Not on file   Highest education level: Not on file  Occupational History   Occupation: Unemployed   Occupation: Airline pilot  Tobacco Use   Smoking status: Former    Types: Cigarettes    Quit date: 05/23/2018    Years since quitting: 2.4   Smokeless tobacco: Never  Vaping Use   Vaping Use: Never used  Substance and Sexual Activity   Alcohol use: Yes    Comment: Occ   Drug use: Yes    Types: Marijuana    Comment: Stopped with current pregnancy 2020   Sexual activity: Yes    Birth control/protection: None  Other Topics Concern   Not on file  Social History Narrative   Married.   No children.   Works for  [employer], yoga, Chiropodist.    Social Determinants of Health   Financial Resource Strain: Not on file  Food Insecurity: Not on file  Transportation Needs: Not on file  Physical Activity: Not on file  Stress: Not on file  Social Connections: Not on file  Intimate Partner Violence: Not on file    Past Surgical History:  Procedure Laterality Date   TONSILLECTOMY AND ADENOIDECTOMY  2008   WISDOM TOOTH EXTRACTION  2017    Family History  Problem Relation Age of Onset   Cancer Maternal Grandmother 33       breast   Early death Maternal Grandmother    Early death Maternal Grandfather    Depression Paternal Grandmother    Depression Paternal Grandfather    Cancer Paternal Grandfather 80       Prostated    No Known Allergies  Current Outpatient Medications on File Prior to Visit  Medication Sig Dispense Refill   montelukast (SINGULAIR) 10 MG tablet Take 10 mg by mouth at bedtime.     No current facility-administered medications on file prior to visit.    BP 110/68   Pulse 80   Temp 97.8 F (36.6 C) (Temporal)   Ht  5\' 4"  (1.626 m)   Wt 128 lb (58.1 kg)   SpO2 99%   BMI 21.97 kg/m  Objective:   Physical Exam Cardiovascular:     Rate and Rhythm: Normal rate.  Pulmonary:     Effort: Pulmonary effort is normal.  Musculoskeletal:     Cervical back: Neck supple.  Skin:    General: Skin is warm and dry.     Findings: Rash present.     Comments: See pictures.            Assessment & Plan:      This visit occurred during the SARS-CoV-2 public health emergency.  Safety protocols were in place, including screening questions prior to the visit, additional usage of staff PPE, and extensive cleaning of exam room while observing appropriate contact time as indicated for disinfecting solutions.

## 2020-11-11 ENCOUNTER — Encounter: Payer: Medicaid Other | Admitting: Primary Care

## 2020-11-26 ENCOUNTER — Ambulatory Visit: Payer: Medicaid Other | Admitting: Primary Care

## 2020-11-26 ENCOUNTER — Other Ambulatory Visit: Payer: Self-pay

## 2020-11-26 ENCOUNTER — Encounter: Payer: Self-pay | Admitting: Primary Care

## 2020-11-26 VITALS — BP 110/62 | HR 74 | Temp 98.2°F | Ht 64.0 in | Wt 128.0 lb

## 2020-11-26 DIAGNOSIS — G43709 Chronic migraine without aura, not intractable, without status migrainosus: Secondary | ICD-10-CM

## 2020-11-26 DIAGNOSIS — Z1159 Encounter for screening for other viral diseases: Secondary | ICD-10-CM | POA: Diagnosis not present

## 2020-11-26 DIAGNOSIS — Z0001 Encounter for general adult medical examination with abnormal findings: Secondary | ICD-10-CM

## 2020-11-26 DIAGNOSIS — R21 Rash and other nonspecific skin eruption: Secondary | ICD-10-CM

## 2020-11-26 DIAGNOSIS — F411 Generalized anxiety disorder: Secondary | ICD-10-CM

## 2020-11-26 DIAGNOSIS — Z0000 Encounter for general adult medical examination without abnormal findings: Secondary | ICD-10-CM | POA: Insufficient documentation

## 2020-11-26 DIAGNOSIS — J4520 Mild intermittent asthma, uncomplicated: Secondary | ICD-10-CM

## 2020-11-26 LAB — COMPREHENSIVE METABOLIC PANEL
ALT: 12 U/L (ref 0–35)
AST: 17 U/L (ref 0–37)
Albumin: 4.4 g/dL (ref 3.5–5.2)
Alkaline Phosphatase: 46 U/L (ref 39–117)
BUN: 10 mg/dL (ref 6–23)
CO2: 26 mEq/L (ref 19–32)
Calcium: 9.6 mg/dL (ref 8.4–10.5)
Chloride: 106 mEq/L (ref 96–112)
Creatinine, Ser: 0.78 mg/dL (ref 0.40–1.20)
GFR: 100.86 mL/min (ref >60.00)
Glucose, Bld: 85 mg/dL (ref 70–99)
Potassium: 3.8 mEq/L (ref 3.5–5.1)
Sodium: 140 mEq/L (ref 135–145)
Total Bilirubin: 1.2 mg/dL (ref 0.2–1.2)
Total Protein: 7.1 g/dL (ref 6.0–8.3)

## 2020-11-26 LAB — CBC
HCT: 40.5 % (ref 36.0–46.0)
Hemoglobin: 13.9 g/dL (ref 12.0–15.0)
MCHC: 34.4 g/dL (ref 30.0–36.0)
MCV: 93.6 fl (ref 78.0–100.0)
Platelets: 279 10*3/uL (ref 150.0–400.0)
RBC: 4.32 Mil/uL (ref 3.87–5.11)
RDW: 13.2 % (ref 11.5–15.5)
WBC: 7.6 10*3/uL (ref 4.0–10.5)

## 2020-11-26 LAB — LIPID PANEL
Cholesterol: 186 mg/dL (ref 0–200)
HDL: 53.4 mg/dL (ref >39.00)
LDL Cholesterol: 120 mg/dL — ABNORMAL HIGH (ref 0–99)
NonHDL: 133.08
Total CHOL/HDL Ratio: 3
Triglycerides: 64 mg/dL (ref 0.0–149.0)
VLDL: 12.8 mg/dL (ref 0.0–40.0)

## 2020-11-26 MED ORDER — ALBUTEROL SULFATE HFA 108 (90 BASE) MCG/ACT IN AERS: 2.0000 | INHALATION_SPRAY | Freq: Four times a day (QID) | RESPIRATORY_TRACT | 0 refills | Status: DC | PRN

## 2020-11-26 MED ORDER — SERTRALINE HCL 25 MG PO TABS: 25.0000 mg | ORAL_TABLET | Freq: Every day | ORAL | 1 refills | Status: DC

## 2020-11-26 NOTE — Assessment & Plan Note (Signed)
Stable, no concerns.  Occasional headaches.

## 2020-11-26 NOTE — Assessment & Plan Note (Signed)
Infrequent use of albuterol inhaler, refill sent to pharmacy. No wheezing on exam.

## 2020-11-26 NOTE — Assessment & Plan Note (Signed)
Resolved after taking prednisone. No rash noted.

## 2020-11-26 NOTE — Patient Instructions (Signed)
Start sertraline (Zoloft) 25 mg for anxiety and depression.   Please contact me if you experience any problems when starting this medication or with the dose increase to 1 full tablet. Common side effects should abate within 1-2 weeks.   Stop by the lab prior to leaving today. I will notify you of your results once received.   Please schedule a follow up visit for 6 weeks for follow up of anxiety/depression.  It was a pleasure to see you today!  Preventive Care 35-52 Years Old, Female Preventive care refers to lifestyle choices and visits with your health care provider that can promote health and wellness. This includes: A yearly physical exam. This is also called an annual wellness visit. Regular dental and eye exams. Immunizations. Screening for certain conditions. Healthy lifestyle choices, such as: Eating a healthy diet. Getting regular exercise. Not using drugs or products that contain nicotine and tobacco. Limiting alcohol use. What can I expect for my preventive care visit? Physical exam Your health care provider may check your: Height and weight. These may be used to calculate your BMI (body mass index). BMI is a measurement that tells if you are at a healthy weight. Heart rate and blood pressure. Body temperature. Skin for abnormal spots. Counseling Your health care provider may ask you questions about your: Past medical problems. Family's medical history. Alcohol, tobacco, and drug use. Emotional well-being. Home life and relationship well-being. Sexual activity. Diet, exercise, and sleep habits. Work and work Statistician. Access to firearms. Method of birth control. Menstrual cycle. Pregnancy history. What immunizations do I need?  Vaccines are usually given at various ages, according to a schedule. Your health care provider will recommend vaccines for you based on your age, medicalhistory, and lifestyle or other factors, such as travel or where you work. What  tests do I need?  Blood tests Lipid and cholesterol levels. These may be checked every 5 years starting at age 63. Hepatitis C test. Hepatitis B test. Screening Diabetes screening. This is done by checking your blood sugar (glucose) after you have not eaten for a while (fasting). STD (sexually transmitted disease) testing, if you are at risk. BRCA-related cancer screening. This may be done if you have a family history of breast, ovarian, tubal, or peritoneal cancers. Pelvic exam and Pap test. This may be done every 3 years starting at age 71. Starting at age 23, this may be done every 5 years if you have a Pap test in combination with an HPV test. Talk with your health care provider about your test results, treatment options,and if necessary, the need for more tests. Follow these instructions at home: Eating and drinking  Eat a healthy diet that includes fresh fruits and vegetables, whole grains, lean protein, and low-fat dairy products. Take vitamin and mineral supplements as recommended by your health care provider. Do not drink alcohol if: Your health care provider tells you not to drink. You are pregnant, may be pregnant, or are planning to become pregnant. If you drink alcohol: Limit how much you have to 0-1 drink a day. Be aware of how much alcohol is in your drink. In the [place] one drink equals one 12 oz bottle of beer (355 mL), one 5 oz glass of wine (148 mL), or one 1 oz glass of hard liquor (44 mL).  Lifestyle Take daily care of your teeth and gums. Brush your teeth every morning and night with fluoride toothpaste. Floss one time each day. Stay active. Exercise for at least  30 minutes 5 or more days each week. Do not use any products that contain nicotine or tobacco, such as cigarettes, e-cigarettes, and chewing tobacco. If you need help quitting, ask your health care provider. Do not use drugs. If you are sexually active, practice safe sex. Use a condom or other form of  protection to prevent STIs (sexually transmitted infections). If you do not wish to become pregnant, use a form of birth control. If you plan to become pregnant, see your health care provider for a prepregnancy visit. Find healthy ways to cope with stress, such as: Meditation, yoga, or listening to music. Journaling. Talking to a trusted person. Spending time with friends and family. Safety Always wear your seat belt while driving or riding in a vehicle. Do not drive: If you have been drinking alcohol. Do not ride with someone who has been drinking. When you are tired or distracted. While texting. Wear a helmet and other protective equipment during sports activities. If you have firearms in your house, make sure you follow all gun safety procedures. Seek help if you have been physically or sexually abused. What's next? Go to your health care provider once a year for an annual wellness visit. Ask your health care provider how often you should have your eyes and teeth checked. Stay up to date on all vaccines. This information is not intended to replace advice given to you by your health care provider. Make sure you discuss any questions you have with your healthcare provider. Document Revised: 11/17/2019 Document Reviewed: 11/30/2017 Elsevier Patient Education  2022 [employer].

## 2020-11-26 NOTE — Assessment & Plan Note (Signed)
Chronic, worse since post partum 1.5 years ago.   Discussed options for treatment. She declines therapy, opts for medication. Zoloft 25 mg sent to pharmacy.  We discussed possible side effects of headache, GI upset, drowsiness, and SI/HI. If thoughts of SI/HI develop, we discussed to present to the emergency immediately. Patient verbalized understanding.   Follow up in 6 weeks for re-evaluation.

## 2020-11-26 NOTE — Assessment & Plan Note (Signed)
Tetanus UTD. Pap smear UTD.  Encouraged regular exercise, healthy diet.  Exam today as noted. Labs pending.

## 2020-11-26 NOTE — Progress Notes (Signed)
Subjective:    Patient ID: Tina Fischer, female    DOB: 03/08/1991, 30 y.o.   MRN: [id number]  HPI  Tina Fischer is a very pleasant 30 y.o. female who presents today for complete physical and follow up of chronic conditions.  She would also like to discuss anxiety. Chronic symptoms of anxiety for years, worse since she has been postpartum 1.5 years ago. Symptoms include feeling anxious, obsessive thoughts, worry. She's tried meditation, yoga, herbal supplements. She is interested in medication treatment. She's read up a lot about Zoloft.   She would also like to be tested mthfr gene.   Immunizations: -Tetanus: 2021 -Influenza: Due this season  -Covid-19: Has not completed  Diet: Healthy diet.  Exercise: Regular exercise.  Eye exam: Completes annually  Dental exam: Completes semi-annually   Pap Smear: Completed in 2021 per GYN.  BP Readings from Last 3 Encounters:  11/26/20 110/62  10/22/20 110/68  07/16/20 122/74         Review of Systems  Constitutional:  Negative for unexpected weight change.  HENT:  Negative for rhinorrhea.   Eyes:  Negative for visual disturbance.  Respiratory:  Negative for shortness of breath.   Cardiovascular:  Negative for chest pain.  Gastrointestinal:  Negative for constipation and diarrhea.  Genitourinary:  Negative for difficulty urinating and menstrual problem.  Musculoskeletal:  Negative for arthralgias and myalgias.  Skin:  Negative for rash.  Allergic/Immunologic: Negative for environmental allergies.  Neurological:  Positive for headaches. Negative for dizziness.  Psychiatric/Behavioral:  The patient is nervous/anxious.        See HPI        Past Medical History:  Diagnosis Date   Allergy    Asthma    Asthma during pregnancy 10/10/2018   Frequent headaches    Lipoma    Migraines     Social History   Socioeconomic History   Marital status: Married    Spouse name: Not on file   Number of children: Not on file    Years of education: Not on file   Highest education level: Not on file  Occupational History   Occupation: Unemployed   Occupation: Airline pilot  Tobacco Use   Smoking status: Former    Types: Cigarettes    Quit date: 05/23/2018    Years since quitting: 2.5   Smokeless tobacco: Never  Vaping Use   Vaping Use: Never used  Substance and Sexual Activity   Alcohol use: Yes    Comment: Occ   Drug use: Yes    Types: Marijuana    Comment: Stopped with current pregnancy 2020   Sexual activity: Yes    Birth control/protection: None  Other Topics Concern   Not on file  Social History Narrative   Married.   No children.   Works for [employer], yoga, Chiropodist.    Social Determinants of Health   Financial Resource Strain: Not on file  Food Insecurity: Not on file  Transportation Needs: Not on file  Physical Activity: Not on file  Stress: Not on file  Social Connections: Not on file  Intimate Partner Violence: Not on file    Past Surgical History:  Procedure Laterality Date   TONSILLECTOMY AND ADENOIDECTOMY  2008   WISDOM TOOTH EXTRACTION  2017    Family History  Problem Relation Age of Onset   Colon polyps Mother    Cancer Maternal Grandmother 69       breast  Early death Maternal Grandmother    Early death Maternal Grandfather    Depression Paternal Grandmother    Depression Paternal Grandfather    Cancer Paternal Grandfather 40       Prostated    No Known Allergies  Current Outpatient Medications on File Prior to Visit  Medication Sig Dispense Refill   cetirizine (ZYRTEC) 10 MG tablet Take 10 mg by mouth daily.     montelukast (SINGULAIR) 10 MG tablet Take 1 tablet (10 mg total) by mouth at bedtime. For allergies and asthma. 90 tablet 0   No current facility-administered medications on file prior to visit.    BP 110/62   Pulse 74   Temp 98.2 F (36.8 C) (Temporal)   Ht '5\' 4"'$  (1.626 m)   Wt 128 lb (58.1 kg)   SpO2  100%   BMI 21.97 kg/m  Objective:   Physical Exam HENT:     Right Ear: Tympanic membrane and ear canal normal.     Left Ear: Tympanic membrane and ear canal normal.     Nose: Nose normal.  Eyes:     Conjunctiva/sclera: Conjunctivae normal.     Pupils: Pupils are equal, round, and reactive to light.  Neck:     Thyroid: No thyromegaly.  Cardiovascular:     Rate and Rhythm: Normal rate and regular rhythm.     Heart sounds: No murmur heard. Pulmonary:     Effort: Pulmonary effort is normal.     Breath sounds: Normal breath sounds. No rales.  Abdominal:     General: Bowel sounds are normal.     Palpations: Abdomen is soft.     Tenderness: There is no abdominal tenderness.  Musculoskeletal:        General: Normal range of motion.     Cervical back: Neck supple.  Lymphadenopathy:     Cervical: No cervical adenopathy.  Skin:    General: Skin is warm and dry.     Findings: No rash.  Neurological:     Mental Status: She is alert and oriented to person, place, and time.     Cranial Nerves: No cranial nerve deficit.     Deep Tendon Reflexes: Reflexes are normal and symmetric.  Psychiatric:        Mood and Affect: Mood normal.          Assessment & Plan:      This visit occurred during the SARS-CoV-2 public health emergency.  Safety protocols were in place, including screening questions prior to the visit, additional usage of staff PPE, and extensive cleaning of exam room while observing appropriate contact time as indicated for disinfecting solutions.

## 2020-11-27 LAB — HEPATITIS C ANTIBODY
Hepatitis C Ab: NONREACTIVE
SIGNAL TO CUT-OFF: 0.01 (ref <1.00)

## 2020-12-18 ENCOUNTER — Other Ambulatory Visit: Payer: Self-pay | Admitting: Primary Care

## 2020-12-18 DIAGNOSIS — F411 Generalized anxiety disorder: Secondary | ICD-10-CM

## 2021-01-12 ENCOUNTER — Encounter: Payer: Self-pay | Admitting: Primary Care

## 2021-01-12 ENCOUNTER — Other Ambulatory Visit: Payer: Self-pay

## 2021-01-12 ENCOUNTER — Telehealth (INDEPENDENT_AMBULATORY_CARE_PROVIDER_SITE_OTHER): Payer: Medicaid Other | Admitting: Primary Care

## 2021-01-12 DIAGNOSIS — F411 Generalized anxiety disorder: Secondary | ICD-10-CM

## 2021-01-12 MED ORDER — SERTRALINE HCL 25 MG PO TABS: 25.0000 mg | ORAL_TABLET | Freq: Every day | ORAL | 1 refills | Status: DC

## 2021-01-12 NOTE — Patient Instructions (Signed)
Continue taking sertraline 25 mg daily for anxiety.  It was a pleasure to see you today!

## 2021-01-12 NOTE — Assessment & Plan Note (Signed)
Improved and appears controlled on sertraline 25 mg.  Continue same. Refills provided. She will reach out if anything changes.

## 2021-01-12 NOTE — Progress Notes (Signed)
Patient ID: Tina Fischer, female    DOB: 02/23/1991, 30 y.o.   MRN: [id number]  Virtual visit completed through [place], a video enabled telemedicine application. Due to national recommendations of social distancing due to COVID-19, a virtual visit is felt to be most appropriate for this patient at this time. Reviewed limitations, risks, security and privacy concerns of performing a virtual visit and the availability of in person appointments. I also reviewed that there may be a patient responsible charge related to this service. The patient agreed to proceed.   Patient location: home Provider location: [place] at [facility], office Persons participating in this virtual visit: patient, provider   If any vitals were documented, they were collected by patient at home unless specified below.    Ht 5\' 4"  (1.626 m)   Wt 128 lb (58.1 kg)   BMI 21.97 kg/m    CC: Follow up of Anxiety  Subjective:   HPI: Tina Fischer is a 30 y.o. female presenting on 01/12/2021 for follow-up of anxiety.  She was last evaluated on 11/26/2020 for her annual physical when she mentioned chronic symptoms of anxiety that had progressed over the last year and a half since she was postpartum.  Symptoms included feeling anxious, obsessive thoughts, worry.  Given her symptoms we initiated Zoloft 25 mg.  She declined therapy.  She is here today to discuss follow-up since that visit.  Since her last evaluation she is compliant to Zoloft 25 mg daily. She has noticed improvement in her symptoms since starting Zoloft. Positive effects include feeling more relaxed, decreased worry, sleeping better, breathing easier.   She has noticed a decrease in her overall appetite but is able to eat daily. She denies SI/HI or other side effects.      Relevant past medical, surgical, family and social history reviewed and updated as indicated. Interim medical history since our last visit reviewed. Allergies and medications  reviewed and updated. Outpatient Medications Prior to Visit  Medication Sig Dispense Refill   albuterol (VENTOLIN HFA) 108 (90 Base) MCG/ACT inhaler Inhale 2 puffs into the lungs every 6 (six) hours as needed for shortness of breath. 1 each 0   cetirizine (ZYRTEC) 10 MG tablet Take 10 mg by mouth daily.     montelukast (SINGULAIR) 10 MG tablet Take 1 tablet (10 mg total) by mouth at bedtime. For allergies and asthma. 90 tablet 0   sertraline (ZOLOFT) 25 MG tablet Take 1 tablet (25 mg total) by mouth daily. For anxiety. 30 tablet 1   No facility-administered medications prior to visit.     Per HPI unless specifically indicated in ROS section below Review of Systems  Respiratory:  Negative for shortness of breath.   Cardiovascular:  Negative for chest pain.  Gastrointestinal:  Negative for nausea.  Neurological:  Negative for headaches.  Psychiatric/Behavioral:         See HPI  Objective:  Ht 5\' 4"  (1.626 m)   Wt 128 lb (58.1 kg)   BMI 21.97 kg/m   Wt Readings from Last 3 Encounters:  01/12/21 128 lb (58.1 kg)  11/26/20 128 lb (58.1 kg)  10/22/20 128 lb (58.1 kg)       Physical exam: Gen: alert, NAD, not ill appearing Pulm: speaks in complete sentences without increased work of breathing Psych: normal mood, normal thought content      Results for orders placed or performed in visit on 11/26/20  Hepatitis C antibody  Result Value Ref Range  Hepatitis C Ab NON-REACTIVE NON-REACTIVE   SIGNAL TO CUT-OFF 0.01 <1.00  Lipid panel  Result Value Ref Range   Cholesterol 186 0 - 200 mg/dL   Triglycerides 64.0 0.0 - 149.0 mg/dL   HDL 53.40 >39.00 mg/dL   VLDL 12.8 0.0 - 40.0 mg/dL   LDL Cholesterol 120 (H) 0 - 99 mg/dL   Total CHOL/HDL Ratio 3    NonHDL 133.08   CBC  Result Value Ref Range   WBC 7.6 4.0 - 10.5 K/uL   RBC 4.32 3.87 - 5.11 Mil/uL   Platelets 279.0 150.0 - 400.0 K/uL   Hemoglobin 13.9 12.0 - 15.0 g/dL   HCT 40.5 36.0 - 46.0 %   MCV 93.6 78.0 - 100.0 fl    MCHC 34.4 30.0 - 36.0 g/dL   RDW 13.2 11.5 - 15.5 %  Comprehensive metabolic panel  Result Value Ref Range   Sodium 140 135 - 145 mEq/L   Potassium 3.8 3.5 - 5.1 mEq/L   Chloride 106 96 - 112 mEq/L   CO2 26 19 - 32 mEq/L   Glucose, Bld 85 70 - 99 mg/dL   BUN 10 6 - 23 mg/dL   Creatinine, Ser 0.78 0.40 - 1.20 mg/dL   Total Bilirubin 1.2 0.2 - 1.2 mg/dL   Alkaline Phosphatase 46 39 - 117 U/L   AST 17 0 - 37 U/L   ALT 12 0 - 35 U/L   Total Protein 7.1 6.0 - 8.3 g/dL   Albumin 4.4 3.5 - 5.2 g/dL   GFR 100.86 >60.00 mL/min   Calcium 9.6 8.4 - 10.5 mg/dL   Assessment & Plan:   Problem List Items Addressed This Visit       Other   GAD (generalized anxiety disorder)    Improved and appears controlled on sertraline 25 mg.  Continue same. Refills provided. She will reach out if anything changes.       Relevant Medications   sertraline (ZOLOFT) 25 MG tablet     Meds ordered this encounter  Medications   sertraline (ZOLOFT) 25 MG tablet    Sig: Take 1 tablet (25 mg total) by mouth daily. For anxiety.    Dispense:  90 tablet    Refill:  1   No orders of the defined types were placed in this encounter.   I discussed the assessment and treatment plan with the patient. The patient was provided an opportunity to ask questions and all were answered. The patient agreed with the plan and demonstrated an understanding of the instructions. The patient was advised to call back or seek an in-person evaluation if the symptoms worsen or if the condition fails to improve as anticipated.  Follow up plan: Continue taking sertraline 25 mg daily for anxiety.  It was a pleasure to see you today!   Pleas Koch, NP

## 2021-01-20 ENCOUNTER — Other Ambulatory Visit: Payer: Self-pay | Admitting: Primary Care

## 2021-01-20 DIAGNOSIS — J4520 Mild intermittent asthma, uncomplicated: Secondary | ICD-10-CM

## 2021-01-20 DIAGNOSIS — J3089 Other allergic rhinitis: Secondary | ICD-10-CM

## 2021-01-20 DIAGNOSIS — R21 Rash and other nonspecific skin eruption: Secondary | ICD-10-CM

## 2021-04-04 NOTE — L&D Delivery Note (Signed)
Delivery Note  First Stage: Labor onset: 0700 Augmentation : cytotec x 1, pitocin, AROM Analgesia /Anesthesia intrapartum: epidural AROM at 1318  Second Stage: Complete dilation at 1423 Onset of pushing at 1425 FHR second stage one variable deceleration  Delivery of a viable female infant 01/11/2022 at 1427 by Tina Fischer, Tina Fischer. delivery of fetal head in OA position with restitution to LOA. No nuchal cord;  Anterior then posterior shoulders delivered easily with gentle downward traction. Baby placed on mom's chest, and attended to by peds.  Cord double clamped after cessation of pulsation, cut by FOB  Third Stage: Placenta delivered Tina Fischer intact with 3 VC @ 1437 Placenta disposition: discarded Uterine tone firm / bleeding scant  1st degree perineal laceration identified  Anesthesia for repair: epidural Repair 2-0 Vicryl Est. Blood Loss (mL): 49QP  Complications: none  Mom to postpartum.  Baby to Couplet care / Skin to Skin.  Newborn: Birth Weight: 7lb 4.1oz  Apgar Scores: 7, 9 Feeding planned: breastfeeding

## 2021-06-16 DIAGNOSIS — N912 Amenorrhea, unspecified: Secondary | ICD-10-CM | POA: Diagnosis not present

## 2021-07-15 DIAGNOSIS — O418X10 Other specified disorders of amniotic fluid and membranes, first trimester, not applicable or unspecified: Secondary | ICD-10-CM | POA: Diagnosis not present

## 2021-07-15 DIAGNOSIS — O468X1 Other antepartum hemorrhage, first trimester: Secondary | ICD-10-CM | POA: Diagnosis not present

## 2021-07-16 DIAGNOSIS — Z3482 Encounter for supervision of other normal pregnancy, second trimester: Secondary | ICD-10-CM | POA: Diagnosis not present

## 2021-07-16 DIAGNOSIS — Z1329 Encounter for screening for other suspected endocrine disorder: Secondary | ICD-10-CM | POA: Diagnosis not present

## 2021-07-16 DIAGNOSIS — Z113 Encounter for screening for infections with a predominantly sexual mode of transmission: Secondary | ICD-10-CM | POA: Diagnosis not present

## 2021-07-16 DIAGNOSIS — Z131 Encounter for screening for diabetes mellitus: Secondary | ICD-10-CM | POA: Diagnosis not present

## 2021-07-16 LAB — OB RESULTS CONSOLE HEPATITIS B SURFACE ANTIGEN: Hepatitis B Surface Ag: NEGATIVE

## 2021-07-16 LAB — OB RESULTS CONSOLE RUBELLA ANTIBODY, IGM: Rubella: IMMUNE

## 2021-07-16 LAB — OB RESULTS CONSOLE VARICELLA ZOSTER ANTIBODY, IGG: Varicella: IMMUNE

## 2021-08-13 DIAGNOSIS — Z3482 Encounter for supervision of other normal pregnancy, second trimester: Secondary | ICD-10-CM | POA: Diagnosis not present

## 2021-10-08 DIAGNOSIS — Z3482 Encounter for supervision of other normal pregnancy, second trimester: Secondary | ICD-10-CM | POA: Diagnosis not present

## 2021-10-15 DIAGNOSIS — Z3482 Encounter for supervision of other normal pregnancy, second trimester: Secondary | ICD-10-CM | POA: Diagnosis not present

## 2021-10-19 DIAGNOSIS — R7309 Other abnormal glucose: Secondary | ICD-10-CM | POA: Diagnosis not present

## 2021-10-25 ENCOUNTER — Other Ambulatory Visit: Payer: Self-pay | Admitting: Primary Care

## 2021-10-25 DIAGNOSIS — F411 Generalized anxiety disorder: Secondary | ICD-10-CM

## 2021-10-25 NOTE — Telephone Encounter (Signed)
Patient is due for CPE/follow up, this will be required prior to any further refills.  Please schedule.   

## 2021-10-26 NOTE — Telephone Encounter (Signed)
Patient already scheduled

## 2021-11-29 DIAGNOSIS — Z23 Encounter for immunization: Secondary | ICD-10-CM | POA: Diagnosis not present

## 2021-11-30 ENCOUNTER — Encounter: Payer: Self-pay | Admitting: Primary Care

## 2021-11-30 ENCOUNTER — Ambulatory Visit (INDEPENDENT_AMBULATORY_CARE_PROVIDER_SITE_OTHER): Payer: Medicaid Other | Admitting: Primary Care

## 2021-11-30 VITALS — BP 110/64 | HR 80 | Temp 97.8°F | Ht 64.0 in | Wt 164.0 lb

## 2021-11-30 DIAGNOSIS — G43709 Chronic migraine without aura, not intractable, without status migrainosus: Secondary | ICD-10-CM

## 2021-11-30 DIAGNOSIS — J4520 Mild intermittent asthma, uncomplicated: Secondary | ICD-10-CM

## 2021-11-30 DIAGNOSIS — R21 Rash and other nonspecific skin eruption: Secondary | ICD-10-CM

## 2021-11-30 DIAGNOSIS — F411 Generalized anxiety disorder: Secondary | ICD-10-CM

## 2021-11-30 DIAGNOSIS — J3089 Other allergic rhinitis: Secondary | ICD-10-CM

## 2021-11-30 DIAGNOSIS — Z0000 Encounter for general adult medical examination without abnormal findings: Secondary | ICD-10-CM | POA: Diagnosis not present

## 2021-11-30 MED ORDER — TRIAMCINOLONE ACETONIDE 0.1 % EX CREA: 1.0000 | TOPICAL_CREAM | Freq: Two times a day (BID) | CUTANEOUS | 0 refills | Status: DC | PRN

## 2021-11-30 NOTE — Patient Instructions (Signed)
You may apply the triamcinolone cream as needed for your rash.   Continue to wean off Zoloft, let me know if you need to resume after delivery.  It was a pleasure to see you today!  Preventive Care 11-33 Years Old, Female Preventive care refers to lifestyle choices and visits with your health care provider that can promote health and wellness. Preventive care visits are also called wellness exams. What can I expect for my preventive care visit? Counseling During your preventive care visit, your health care provider may ask about your: Medical history, including: Past medical problems. Family medical history. Pregnancy history. Current health, including: Menstrual cycle. Method of birth control. Emotional well-being. Home life and relationship well-being. Sexual activity and sexual health. Lifestyle, including: Alcohol, nicotine or tobacco, and drug use. Access to firearms. Diet, exercise, and sleep habits. Work and work Statistician. Sunscreen use. Safety issues such as seatbelt and bike helmet use. Physical exam Your health care provider may check your: Height and weight. These may be used to calculate your BMI (body mass index). BMI is a measurement that tells if you are at a healthy weight. Waist circumference. This measures the distance around your waistline. This measurement also tells if you are at a healthy weight and may help predict your risk of certain diseases, such as type 2 diabetes and high blood pressure. Heart rate and blood pressure. Body temperature. Skin for abnormal spots. What immunizations do I need?  Vaccines are usually given at various ages, according to a schedule. Your health care provider will recommend vaccines for you based on your age, medical history, and lifestyle or other factors, such as travel or where you work. What tests do I need? Screening Your health care provider may recommend screening tests for certain conditions. This may  include: Pelvic exam and Pap test. Lipid and cholesterol levels. Diabetes screening. This is done by checking your blood sugar (glucose) after you have not eaten for a while (fasting). Hepatitis B test. Hepatitis C test. HIV (human immunodeficiency virus) test. STI (sexually transmitted infection) testing, if you are at risk. BRCA-related cancer screening. This may be done if you have a family history of breast, ovarian, tubal, or peritoneal cancers. Talk with your health care provider about your test results, treatment options, and if necessary, the need for more tests. Follow these instructions at home: Eating and drinking  Eat a healthy diet that includes fresh fruits and vegetables, whole grains, lean protein, and low-fat dairy products. Take vitamin and mineral supplements as recommended by your health care provider. Do not drink alcohol if: Your health care provider tells you not to drink. You are pregnant, may be pregnant, or are planning to become pregnant. If you drink alcohol: Limit how much you have to 0-1 drink a day. Know how much alcohol is in your drink. In the [place] one drink equals one 12 oz bottle of beer (355 mL), one 5 oz glass of wine (148 mL), or one 1 oz glass of hard liquor (44 mL). Lifestyle Brush your teeth every morning and night with fluoride toothpaste. Floss one time each day. Exercise for at least 30 minutes 5 or more days each week. Do not use any products that contain nicotine or tobacco. These products include cigarettes, chewing tobacco, and vaping devices, such as e-cigarettes. If you need help quitting, ask your health care provider. Do not use drugs. If you are sexually active, practice safe sex. Use a condom or other form of protection to prevent STIs.  If you do not wish to become pregnant, use a form of birth control. If you plan to become pregnant, see your health care provider for a prepregnancy visit. Find healthy ways to manage stress, such  as: Meditation, yoga, or listening to music. Journaling. Talking to a trusted person. Spending time with friends and family. Minimize exposure to UV radiation to reduce your risk of skin cancer. Safety Always wear your seat belt while driving or riding in a vehicle. Do not drive: If you have been drinking alcohol. Do not ride with someone who has been drinking. If you have been using any mind-altering substances or drugs. While texting. When you are tired or distracted. Wear a helmet and other protective equipment during sports activities. If you have firearms in your house, make sure you follow all gun safety procedures. Seek help if you have been physically or sexually abused. What's next? Go to your health care provider once a year for an annual wellness visit. Ask your health care provider how often you should have your eyes and teeth checked. Stay up to date on all vaccines. This information is not intended to replace advice given to you by your health care provider. Make sure you discuss any questions you have with your health care provider. Document Revised: 09/16/2020 Document Reviewed: 09/16/2020 Elsevier Patient Education  [place].

## 2021-11-30 NOTE — Assessment & Plan Note (Signed)
Intermittent.   Unclear cause. Recommended she establish with dermatology. Rx for triamcinolone 1% cream provided to use PRN. From the literature, this is generally safe to use during pregnancy and breast feeding.

## 2021-11-30 NOTE — Assessment & Plan Note (Signed)
Controlled.   Continue albuterol inhaler as needed prior to exercise.

## 2021-11-30 NOTE — Assessment & Plan Note (Signed)
Controlled. Allergy to cats.   Continue Singulair 10 mg HS. Continue Zyrtec 10 mg daily.   Consider switching to another option postpartum given side effects of anxiety.

## 2021-11-30 NOTE — Assessment & Plan Note (Signed)
Improved!  She is working to wean off sertraline and is down to 12.5 mg.  Will keep on file for potential post partum depression.   Continue to monitor.

## 2021-11-30 NOTE — Assessment & Plan Note (Signed)
Immunizations UTD. Pap smear UTD  Discussed the importance of a healthy diet and regular exercise in order for weight loss, and to reduce the risk of further co-morbidity.  Exam stable. Labs reviewed from Care Everywhere  Follow up in 1 year for repeat physical.

## 2021-11-30 NOTE — Progress Notes (Signed)
Subjective:    Patient ID: Tina Fischer, female    DOB: 1990/05/07, 31 y.o.   MRN: [id number]  HPI  Tina Fischer is a very pleasant 31 y.o. female who presents today for complete physical and follow up of chronic conditions.  Immunizations: -Tetanus: 2023 -Influenza: N/A, pregnancy  Diet: Fair diet.  Exercise: No regular exercise.  Eye exam: Completes annually  Dental exam: Completes semi-annually   Pap Smear: Completed in 2021  BP Readings from Last 3 Encounters:  11/30/21 110/64  11/26/20 110/62  10/22/20 110/68        Review of Systems  Constitutional:  Negative for unexpected weight change.  HENT:  Negative for rhinorrhea.   Respiratory:  Negative for cough and shortness of breath.   Cardiovascular:  Negative for chest pain.  Gastrointestinal:  Negative for constipation and diarrhea.  Genitourinary:  Negative for difficulty urinating.  Musculoskeletal:  Negative for arthralgias and myalgias.  Skin:  Positive for rash.  Allergic/Immunologic: Negative for environmental allergies.  Neurological:  Negative for dizziness and headaches.  Psychiatric/Behavioral:  The patient is not nervous/anxious.          Past Medical History:  Diagnosis Date   Allergy    Asthma    Asthma during pregnancy 10/10/2018   Frequent headaches    Lipoma    Migraines     Social History   Socioeconomic History   Marital status: Married    Spouse name: Not on file   Number of children: Not on file   Years of education: Not on file   Highest education level: Not on file  Occupational History   Occupation: Unemployed   Occupation: Airline pilot  Tobacco Use   Smoking status: Former    Types: Cigarettes    Quit date: 05/23/2018    Years since quitting: 3.5   Smokeless tobacco: Never  Vaping Use   Vaping Use: Never used  Substance and Sexual Activity   Alcohol use: Yes    Comment: Occ   Drug use: Yes    Types: Marijuana    Comment: Stopped with  current pregnancy 2020   Sexual activity: Yes    Birth control/protection: None  Other Topics Concern   Not on file  Social History Narrative   Married.   No children.   Works for [employer], yoga, Chiropodist.    Social Determinants of Health   Financial Resource Strain: Not on file  Food Insecurity: Not on file  Transportation Needs: Not on file  Physical Activity: Not on file  Stress: Not on file  Social Connections: Not on file  Intimate Partner Violence: Not on file    Past Surgical History:  Procedure Laterality Date   TONSILLECTOMY AND ADENOIDECTOMY  2008   WISDOM TOOTH EXTRACTION  2017    Family History  Problem Relation Age of Onset   Colon polyps Mother    Cancer Maternal Grandmother 33       breast   Early death Maternal Grandmother    Early death Maternal Grandfather    Depression Paternal Grandmother    Depression Paternal Grandfather    Cancer Paternal Grandfather 80       Prostated    No Known Allergies  Current Outpatient Medications on File Prior to Visit  Medication Sig Dispense Refill   albuterol (VENTOLIN HFA) 108 (90 Base) MCG/ACT inhaler Inhale 2 puffs into the lungs every 6 (six) hours as needed for shortness of breath.  1 each 0   cetirizine (ZYRTEC) 10 MG tablet Take 10 mg by mouth daily.     montelukast (SINGULAIR) 10 MG tablet TAKE 1 TABLET (10 MG TOTAL) BY MOUTH AT BEDTIME. FOR ALLERGIES AND ASTHMA. 90 tablet 2   Prenatal Multivit-Min-Fe-FA (PRENATAL, W/IRON & FA,) 27-0.8 MG TABS Take 1 tablet by mouth daily.     sertraline (ZOLOFT) 25 MG tablet Take 1 tablet (25 mg total) by mouth daily. For anxiety. Office visit required for further refills. 90 tablet 0   No current facility-administered medications on file prior to visit.    BP 110/64   Pulse 80   Temp 97.8 F (36.6 C) (Oral)   Ht '5\' 4"'$  (1.626 m)   Wt 164 lb (74.4 kg)   SpO2 98%   BMI 28.15 kg/m  Objective:   Physical Exam HENT:     Right Ear:  Tympanic membrane and ear canal normal.     Left Ear: Tympanic membrane and ear canal normal.     Nose: Nose normal.  Eyes:     Conjunctiva/sclera: Conjunctivae normal.     Pupils: Pupils are equal, round, and reactive to light.  Neck:     Thyroid: No thyromegaly.  Cardiovascular:     Rate and Rhythm: Normal rate and regular rhythm.     Heart sounds: No murmur heard. Pulmonary:     Effort: Pulmonary effort is normal.     Breath sounds: Normal breath sounds. No rales.  Abdominal:     General: Bowel sounds are normal.     Palpations: Abdomen is soft.     Tenderness: There is no abdominal tenderness.  Musculoskeletal:        General: Normal range of motion.     Cervical back: Neck supple.  Lymphadenopathy:     Cervical: No cervical adenopathy.  Skin:    General: Skin is warm and dry.     Findings: No rash.  Neurological:     Mental Status: She is alert and oriented to person, place, and time.     Cranial Nerves: No cranial nerve deficit.     Deep Tendon Reflexes: Reflexes are normal and symmetric.  Psychiatric:        Mood and Affect: Mood normal.           Assessment & Plan:   Problem List Items Addressed This Visit       Cardiovascular and Mediastinum   Migraines    Controlled.  No recent migraines. Continue to monitor        Respiratory   Mild intermittent asthma without complication    Controlled.   Continue albuterol inhaler as needed prior to exercise.       Allergic rhinitis due to allergen    Controlled. Allergy to cats.   Continue Singulair 10 mg HS. Continue Zyrtec 10 mg daily.   Consider switching to another option postpartum given side effects of anxiety.         Musculoskeletal and Integument   Rash and nonspecific skin eruption - Primary    Intermittent.   Unclear cause. Recommended she establish with dermatology. Rx for triamcinolone 1% cream provided to use PRN. From the literature, this is generally safe to use during pregnancy  and breast feeding.      Relevant Medications   triamcinolone cream (KENALOG) 0.1 %     Other   Preventative health care    Immunizations UTD. Pap smear UTD  Discussed the importance of a healthy diet and  regular exercise in order for weight loss, and to reduce the risk of further co-morbidity.  Exam stable. Labs reviewed from Care Everywhere  Follow up in 1 year for repeat physical.       GAD (generalized anxiety disorder)    Improved!  She is working to wean off sertraline and is down to 12.5 mg.  Will keep on file for potential post partum depression.   Continue to monitor.           Pleas Koch, NP

## 2021-11-30 NOTE — Assessment & Plan Note (Signed)
Controlled.  No recent migraines. Continue to monitor

## 2021-12-01 ENCOUNTER — Encounter: Payer: Medicaid Other | Admitting: Primary Care

## 2021-12-02 ENCOUNTER — Encounter: Payer: Medicaid Other | Admitting: Primary Care

## 2021-12-13 DIAGNOSIS — Z3483 Encounter for supervision of other normal pregnancy, third trimester: Secondary | ICD-10-CM | POA: Diagnosis not present

## 2021-12-13 LAB — OB RESULTS CONSOLE GC/CHLAMYDIA
Chlamydia: NEGATIVE
Neisseria Gonorrhea: NEGATIVE

## 2021-12-13 LAB — OB RESULTS CONSOLE GBS: GBS: NEGATIVE

## 2021-12-20 DIAGNOSIS — Z3483 Encounter for supervision of other normal pregnancy, third trimester: Secondary | ICD-10-CM | POA: Diagnosis not present

## 2021-12-20 DIAGNOSIS — N898 Other specified noninflammatory disorders of vagina: Secondary | ICD-10-CM | POA: Diagnosis not present

## 2021-12-20 LAB — OB RESULTS CONSOLE RPR: RPR: NONREACTIVE

## 2021-12-20 LAB — OB RESULTS CONSOLE HIV ANTIBODY (ROUTINE TESTING): HIV: NONREACTIVE

## 2021-12-24 DIAGNOSIS — O26843 Uterine size-date discrepancy, third trimester: Secondary | ICD-10-CM | POA: Diagnosis not present

## 2022-01-03 ENCOUNTER — Other Ambulatory Visit: Payer: Self-pay | Admitting: Primary Care

## 2022-01-03 DIAGNOSIS — J4520 Mild intermittent asthma, uncomplicated: Secondary | ICD-10-CM

## 2022-01-03 NOTE — Telephone Encounter (Signed)
Received refill for albuterol inhaler. Does she actually need refill? This was last filled in late August.

## 2022-01-04 NOTE — Telephone Encounter (Signed)
Last refill was a little over a year ago, 11/26/2020.

## 2022-01-10 ENCOUNTER — Other Ambulatory Visit: Payer: Self-pay | Admitting: Obstetrics and Gynecology

## 2022-01-10 DIAGNOSIS — N898 Other specified noninflammatory disorders of vagina: Secondary | ICD-10-CM | POA: Diagnosis not present

## 2022-01-10 DIAGNOSIS — O4290 Premature rupture of membranes, unspecified as to length of time between rupture and onset of labor, unspecified weeks of gestation: Secondary | ICD-10-CM | POA: Diagnosis not present

## 2022-01-10 DIAGNOSIS — Z3490 Encounter for supervision of normal pregnancy, unspecified, unspecified trimester: Secondary | ICD-10-CM

## 2022-01-10 DIAGNOSIS — O26893 Other specified pregnancy related conditions, third trimester: Secondary | ICD-10-CM | POA: Diagnosis not present

## 2022-01-10 NOTE — Progress Notes (Signed)
Tina Fischer is a 30 y.o. G22P1021 female at 77w1ddated by LMP.  She presents to L&D for elective IOL  Pregnancy Issues: 1. History of asthma 2. Maternal Mental health dx - Dx: ADHD, GAD 3. size < dates [ u/s 12/24/21 efw= 2895 g= 42 %    [facility]    EFW: 12/24/21: 2895 G=42%   Pertinent Results:  Prenatal Labs: Blood type/Rh A pos  Antibody screen neg  Rubella Immune  Varicella Immune  RPR NR  HBsAg Neg  HIV NR  GC neg  Chlamydia neg  Genetic screening negative  1 hour GTT 137  3 hour GTT  F83, 100, 82  GBS Neg    5. Post Partum Planning: - Infant feeding: Breastfeeding - Contraception: TBD - Tdap: given 11/29/21  - Flu: declined 12/20/21  Tina Fischer, CNM 01/10/2022 4:53 PM

## 2022-01-11 ENCOUNTER — Inpatient Hospital Stay: Payer: Medicaid Other | Admitting: General Practice

## 2022-01-11 ENCOUNTER — Other Ambulatory Visit: Payer: Self-pay

## 2022-01-11 ENCOUNTER — Inpatient Hospital Stay
Admission: EM | Admit: 2022-01-11 | Discharge: 2022-01-12 | DRG: 807 | Disposition: A | Discharge status: Home / Self Care | Payer: Medicaid Other | Attending: Obstetrics and Gynecology | Admitting: Obstetrics and Gynecology

## 2022-01-11 ENCOUNTER — Encounter: Payer: Self-pay | Admitting: Obstetrics and Gynecology

## 2022-01-11 DIAGNOSIS — F909 Attention-deficit hyperactivity disorder, unspecified type: Secondary | ICD-10-CM | POA: Diagnosis not present

## 2022-01-11 DIAGNOSIS — Z3490 Encounter for supervision of normal pregnancy, unspecified, unspecified trimester: Secondary | ICD-10-CM | POA: Diagnosis present

## 2022-01-11 DIAGNOSIS — Z3A40 40 weeks gestation of pregnancy: Secondary | ICD-10-CM

## 2022-01-11 DIAGNOSIS — O9952 Diseases of the respiratory system complicating childbirth: Secondary | ICD-10-CM | POA: Diagnosis present

## 2022-01-11 DIAGNOSIS — O480 Post-term pregnancy: Principal | ICD-10-CM | POA: Diagnosis present

## 2022-01-11 DIAGNOSIS — F411 Generalized anxiety disorder: Secondary | ICD-10-CM | POA: Diagnosis present

## 2022-01-11 DIAGNOSIS — Z87891 Personal history of nicotine dependence: Secondary | ICD-10-CM

## 2022-01-11 DIAGNOSIS — J4520 Mild intermittent asthma, uncomplicated: Secondary | ICD-10-CM | POA: Diagnosis present

## 2022-01-11 DIAGNOSIS — O99344 Other mental disorders complicating childbirth: Secondary | ICD-10-CM | POA: Diagnosis not present

## 2022-01-11 LAB — CBC
HCT: 35.6 % — ABNORMAL LOW (ref 36.0–46.0)
Hemoglobin: 12.4 g/dL (ref 12.0–15.0)
MCH: 32.5 pg (ref 26.0–34.0)
MCHC: 34.8 g/dL (ref 30.0–36.0)
MCV: 93.4 fL (ref 80.0–100.0)
Platelets: 206 10*3/uL (ref 150–400)
RBC: 3.81 MIL/uL — ABNORMAL LOW (ref 3.87–5.11)
RDW: 12.2 % (ref 11.5–15.5)
WBC: 10.8 10*3/uL — ABNORMAL HIGH (ref 4.0–10.5)
nRBC: 0 % (ref 0.0–0.2)

## 2022-01-11 LAB — TYPE AND SCREEN
ABO/RH(D): A POS
Antibody Screen: NEGATIVE

## 2022-01-11 LAB — RPR: RPR Ser Ql: NONREACTIVE

## 2022-01-11 MED ORDER — OXYTOCIN-SODIUM CHLORIDE 30-0.9 UT/500ML-% IV SOLN
1.0000 m[IU]/min | INTRAVENOUS | Status: DC
  Administered 2022-01-11: 2 m[IU]/min via INTRAVENOUS
  Filled 2022-01-11: qty 500

## 2022-01-11 MED ORDER — SOD CITRATE-CITRIC ACID 500-334 MG/5ML PO SOLN: 30.0000 mL | ORAL | Status: DC | PRN

## 2022-01-11 MED ORDER — OXYTOCIN BOLUS FROM INFUSION
333.0000 mL | Freq: Once | INTRAVENOUS | Status: AC
  Administered 2022-01-11: 333 mL via INTRAVENOUS

## 2022-01-11 MED ORDER — OXYTOCIN-SODIUM CHLORIDE 30-0.9 UT/500ML-% IV SOLN: 2.5000 [IU]/h | INTRAVENOUS | Status: DC

## 2022-01-11 MED ORDER — MISOPROSTOL 25 MCG QUARTER TABLET
25.0000 ug | ORAL_TABLET | ORAL | Status: DC
  Administered 2022-01-11: 25 ug via ORAL
  Filled 2022-01-11: qty 1

## 2022-01-11 MED ORDER — ZOLPIDEM TARTRATE 5 MG PO TABS: 5.0000 mg | ORAL_TABLET | Freq: Every evening | ORAL | Status: DC | PRN

## 2022-01-11 MED ORDER — LACTATED RINGERS IV SOLN: 500.0000 mL | INTRAVENOUS | Status: DC | PRN

## 2022-01-11 MED ORDER — LORATADINE 10 MG PO TABS: 10.0000 mg | ORAL_TABLET | Freq: Every day | ORAL | Status: DC | PRN

## 2022-01-11 MED ORDER — SENNOSIDES-DOCUSATE SODIUM 8.6-50 MG PO TABS
2.0000 | ORAL_TABLET | Freq: Every day | ORAL | Status: DC
  Administered 2022-01-12: 2 via ORAL
  Filled 2022-01-11: qty 2

## 2022-01-11 MED ORDER — EPHEDRINE 5 MG/ML INJ: 10.0000 mg | INTRAVENOUS | Status: DC | PRN

## 2022-01-11 MED ORDER — ONDANSETRON HCL 4 MG/2ML IJ SOLN: 4.0000 mg | Freq: Four times a day (QID) | INTRAMUSCULAR | Status: DC | PRN

## 2022-01-11 MED ORDER — DIBUCAINE (PERIANAL) 1 % EX OINT
1.0000 | TOPICAL_OINTMENT | CUTANEOUS | Status: DC | PRN
  Administered 2022-01-11: 1 via RECTAL
  Filled 2022-01-11 (×2): qty 28

## 2022-01-11 MED ORDER — SODIUM CHLORIDE 0.9% FLUSH: 3.0000 mL | INTRAVENOUS | Status: DC | PRN

## 2022-01-11 MED ORDER — TERBUTALINE SULFATE 1 MG/ML IJ SOLN: 0.2500 mg | Freq: Once | INTRAMUSCULAR | Status: DC | PRN

## 2022-01-11 MED ORDER — BENZOCAINE-MENTHOL 20-0.5 % EX AERO
1.0000 | INHALATION_SPRAY | CUTANEOUS | Status: DC | PRN
  Administered 2022-01-11: 1 via TOPICAL
  Filled 2022-01-11 (×2): qty 56

## 2022-01-11 MED ORDER — DIPHENHYDRAMINE HCL 25 MG PO CAPS: 25.0000 mg | ORAL_CAPSULE | Freq: Four times a day (QID) | ORAL | Status: DC | PRN

## 2022-01-11 MED ORDER — FENTANYL-BUPIVACAINE-NACL 0.5-0.125-0.9 MG/250ML-% EP SOLN
12.0000 mL/h | EPIDURAL | Status: DC | PRN
  Administered 2022-01-11: 12 mL/h via EPIDURAL

## 2022-01-11 MED ORDER — LACTATED RINGERS IV SOLN
500.0000 mL | Freq: Once | INTRAVENOUS | Status: AC
  Administered 2022-01-11: 500 mL via INTRAVENOUS

## 2022-01-11 MED ORDER — SIMETHICONE 80 MG PO CHEW
80.0000 mg | CHEWABLE_TABLET | ORAL | Status: DC | PRN
  Administered 2022-01-11: 80 mg via ORAL
  Filled 2022-01-11: qty 1

## 2022-01-11 MED ORDER — MISOPROSTOL 25 MCG QUARTER TABLET
25.0000 ug | ORAL_TABLET | ORAL | Status: DC
  Administered 2022-01-11: 25 ug via VAGINAL
  Filled 2022-01-11: qty 1

## 2022-01-11 MED ORDER — COCONUT OIL OIL: 1.0000 | TOPICAL_OIL | Status: DC | PRN

## 2022-01-11 MED ORDER — LIDOCAINE-EPINEPHRINE (PF) 1.5 %-1:200000 IJ SOLN
INTRAMUSCULAR | Status: DC | PRN
  Administered 2022-01-11: 3 mL via PERINEURAL

## 2022-01-11 MED ORDER — IBUPROFEN 600 MG PO TABS
600.0000 mg | ORAL_TABLET | Freq: Four times a day (QID) | ORAL | Status: DC
  Administered 2022-01-11 – 2022-01-12 (×4): 600 mg via ORAL
  Filled 2022-01-11 (×4): qty 1

## 2022-01-11 MED ORDER — FENTANYL-BUPIVACAINE-NACL 0.5-0.125-0.9 MG/250ML-% EP SOLN
EPIDURAL | Status: AC
  Filled 2022-01-11: qty 250

## 2022-01-11 MED ORDER — PHENYLEPHRINE 80 MCG/ML (10ML) SYRINGE FOR IV PUSH (FOR BLOOD PRESSURE SUPPORT): 80.0000 ug | PREFILLED_SYRINGE | INTRAVENOUS | Status: DC | PRN

## 2022-01-11 MED ORDER — SODIUM CHLORIDE 0.9 % IV SOLN: 250.0000 mL | INTRAVENOUS | Status: DC | PRN

## 2022-01-11 MED ORDER — OXYCODONE-ACETAMINOPHEN 5-325 MG PO TABS: 2.0000 | ORAL_TABLET | ORAL | Status: DC | PRN

## 2022-01-11 MED ORDER — ACETAMINOPHEN 325 MG PO TABS: 650.0000 mg | ORAL_TABLET | ORAL | Status: DC | PRN

## 2022-01-11 MED ORDER — ONDANSETRON HCL 4 MG PO TABS: 4.0000 mg | ORAL_TABLET | ORAL | Status: DC | PRN

## 2022-01-11 MED ORDER — WITCH HAZEL-GLYCERIN EX PADS
1.0000 | MEDICATED_PAD | CUTANEOUS | Status: DC | PRN
  Administered 2022-01-11: 1 via TOPICAL
  Filled 2022-01-11 (×2): qty 100

## 2022-01-11 MED ORDER — FENTANYL CITRATE (PF) 100 MCG/2ML IJ SOLN: 50.0000 ug | INTRAMUSCULAR | Status: DC | PRN

## 2022-01-11 MED ORDER — ONDANSETRON HCL 4 MG/2ML IJ SOLN: 4.0000 mg | INTRAMUSCULAR | Status: DC | PRN

## 2022-01-11 MED ORDER — LACTATED RINGERS IV SOLN: INTRAVENOUS | Status: DC

## 2022-01-11 MED ORDER — OXYCODONE-ACETAMINOPHEN 5-325 MG PO TABS: 1.0000 | ORAL_TABLET | ORAL | Status: DC | PRN

## 2022-01-11 MED ORDER — DIPHENHYDRAMINE HCL 50 MG/ML IJ SOLN: 12.5000 mg | INTRAMUSCULAR | Status: DC | PRN

## 2022-01-11 MED ORDER — ALBUTEROL SULFATE (2.5 MG/3ML) 0.083% IN NEBU: 3.0000 mL | INHALATION_SOLUTION | Freq: Four times a day (QID) | RESPIRATORY_TRACT | Status: DC | PRN

## 2022-01-11 MED ORDER — TETANUS-DIPHTH-ACELL PERTUSSIS 5-2.5-18.5 LF-MCG/0.5 IM SUSY: 0.5000 mL | PREFILLED_SYRINGE | Freq: Once | INTRAMUSCULAR | Status: DC

## 2022-01-11 MED ORDER — LIDOCAINE HCL (PF) 1 % IJ SOLN
INTRAMUSCULAR | Status: DC | PRN
  Administered 2022-01-11: 3 mL

## 2022-01-11 MED ORDER — LIDOCAINE HCL (PF) 1 % IJ SOLN: 30.0000 mL | INTRAMUSCULAR | Status: DC | PRN

## 2022-01-11 MED ORDER — MONTELUKAST SODIUM 10 MG PO TABS
10.0000 mg | ORAL_TABLET | Freq: Every day | ORAL | Status: DC
  Administered 2022-01-11: 10 mg via ORAL
  Filled 2022-01-11: qty 1

## 2022-01-11 MED ORDER — PRENATAL MULTIVITAMIN CH
1.0000 | ORAL_TABLET | Freq: Every day | ORAL | Status: DC
  Administered 2022-01-12: 1 via ORAL
  Filled 2022-01-11: qty 1

## 2022-01-11 MED ORDER — BUPIVACAINE HCL (PF) 0.25 % IJ SOLN
INTRAMUSCULAR | Status: DC | PRN
  Administered 2022-01-11 (×3): 2 mL via EPIDURAL
  Administered 2022-01-11: 5 mL via EPIDURAL

## 2022-01-11 MED ORDER — SODIUM CHLORIDE 0.9% FLUSH: 3.0000 mL | Freq: Two times a day (BID) | INTRAVENOUS | Status: DC

## 2022-01-11 MED ORDER — ACETAMINOPHEN 325 MG PO TABS
650.0000 mg | ORAL_TABLET | ORAL | Status: DC | PRN
  Administered 2022-01-11 – 2022-01-12 (×3): 650 mg via ORAL
  Filled 2022-01-11 (×3): qty 2

## 2022-01-11 NOTE — H&P (Signed)
OB History & Physical   History of Present Illness:  Chief Complaint:   HPI:  SALAYA HOLTROP is a 31 y.o. G107P1021 female at 88w2ddated by LMP.  She presents to L&D for   Active FM Starting to feel contractions from induction    Pregnancy Issues: 1. History of asthma 2. Maternal Mental health dx - Dx: ADHD, GAD 3. size < dates [ u/s 12/24/21 efw= 2895 g= 42 %   Maternal Medical History:   Past Medical History:  Diagnosis Date   Allergy    Asthma    Asthma during pregnancy 10/10/2018   Frequent headaches    Lipoma    Migraines     Past Surgical History:  Procedure Laterality Date   TONSILLECTOMY AND ADENOIDECTOMY  2008   WISDOM TOOTH EXTRACTION  2017    No Known Allergies  Prior to Admission medications   Medication Sig Start Date End Date Taking? Authorizing Provider  albuterol (PROAIR HFA) 108 (90 Base) MCG/ACT inhaler INHALE 2 PUFFS INTO THE LUNGS EVERY 6 HOURS AS NEEDED FOR SHORTNESS OF BREATH 01/04/22  Yes CPleas Koch NP  cetirizine (ZYRTEC) 10 MG tablet Take 10 mg by mouth daily.   Yes [provider]  montelukast (SINGULAIR) 10 MG tablet TAKE 1 TABLET (10 MG TOTAL) BY MOUTH AT BEDTIME. FOR ALLERGIES AND ASTHMA. 01/21/21  Yes CPleas Koch NP  Prenatal Multivit-Min-Fe-FA (PRENATAL, W/IRON & FA,) 27-0.8 MG TABS Take 1 tablet by mouth daily.   Yes [provider]  sertraline (ZOLOFT) 25 MG tablet Take 1 tablet (25 mg total) by mouth daily. For anxiety. Office visit required for further refills. Patient not taking: Reported on 01/11/2022 10/25/21   CPleas Koch NP  triamcinolone cream (KENALOG) 0.1 % Apply 1 Application topically 2 (two) times daily as needed (rash). Patient not taking: Reported on 01/11/2022 11/30/21   CPleas Koch NP     [facility] [place]  reports that she quit smoking about 3 years ago. Her smoking use included cigarettes. She has never used smokeless  tobacco. She reports current alcohol use. She reports current drug use. Drug: Marijuana.  Family History: family history includes Cancer (age of onset: 675 in her maternal grandmother; Cancer (age of onset: 827 in her paternal grandfather; Colon polyps in her mother; Depression in her paternal grandfather and paternal grandmother; Early death in her maternal grandfather and maternal grandmother.   Review of Systems: A full review of systems was performed and negative except as noted in the HPI.     Physical Exam:  Vital Signs: BP 125/74   Pulse 83   Temp 98.2 F (36.8 C) (Oral)   Resp 18   Ht '5\' 4"'$  (1.626 m)   Wt 78.1 kg   LMP 04/04/2021   BMI 29.56 kg/m  General: no acute distress.  HEENT: normocephalic, atraumatic Heart: regular rate & rhythm.  No murmurs/rubs/gallops Lungs: clear to auscultation bilaterally, normal respiratory effort Abdomen: soft, gravid, non-tender;  EFW: 7lb Pelvic:   External: Normal external female genitalia  Cervix: Dilation: 3 / Effacement (%): 50 / Station: -3    Extremities: non-tender, symmetric, mild edema bilaterally.  DTRs: +2  Neurologic: Alert & oriented x 3.    Results for orders placed or performed during the hospital encounter of 01/11/22 (from the past 24 hour(s))  CBC     Status: Abnormal   Collection Time: 01/11/22  6:34 AM  Result Value Ref  Range   WBC 10.8 (H) 4.0 - 10.5 K/uL   RBC 3.81 (L) 3.87 - 5.11 MIL/uL   Hemoglobin 12.4 12.0 - 15.0 g/dL   HCT 35.6 (L) 36.0 - 46.0 %   MCV 93.4 80.0 - 100.0 fL   MCH 32.5 26.0 - 34.0 pg   MCHC 34.8 30.0 - 36.0 g/dL   RDW 12.2 11.5 - 15.5 %   Platelets 206 150 - 400 K/uL   nRBC 0.0 0.0 - 0.2 %  Type and screen     Status: None   Collection Time: 01/11/22  6:35 AM  Result Value Ref Range   ABO/RH(D) A POS    Antibody Screen NEG    Sample Expiration      01/14/2022,2359 Performed at [facility], [address] [place], [place] [address]     Pertinent Results:  Prenatal  Labs: Blood type/Rh A pos  Antibody screen neg  Rubella Immune  Varicella Immune  RPR NR  HBsAg Neg  HIV NR  GC neg  Chlamydia neg  Genetic screening negative  1 hour GTT 137  3 hour GTT  F83, 100, 82  GBS Neg   FHT:145bpm, moderate variability, accels present, no decelerations TOCO: contractions q2-34mn, palpate mild SVE:  Dilation: 3 / Effacement (%): 50 / Station: -3    Cephalic by leopolds  No results found.  Assessment:  HMELIDA NORTHINGTONis a 31y.o. GG28P1021female at 462w2dith elective IOL.   Plan:  1. Admit to Labor & Delivery; consents reviewed and obtained  2. Fetal Well being  - Fetal Tracing: Category I tracing - Group B Streptococcus ppx indicated: n/a, GBS negative - Presentation: vertex confirmed by SVE   3. Routine OB: - Prenatal labs reviewed, as above - Rh positive - CBC, T&S, RPR on admit - Clear fluids, IVF  4. Induction of Labor -  Contractions q2-2m68m external toco in place -  Pelvis proven to 2980g -  Plan for induction with cytotec, pitocin, AROM -  Plan for continuous fetal monitoring  -  Maternal pain control as desired; requesting regional anesthesia - Anticipate vaginal delivery  5. Post Partum Planning: - Infant feeding: breastfeeding - Contraception: TBD  DanGertie FeyNM 01/11/22 10:05 AM

## 2022-01-11 NOTE — Progress Notes (Signed)
Labor Progress Note  Tina Fischer is a 31 y.o. G5P1021 at 9w2dby LMP admitted for induction of labor due to Elective at term.  Subjective: Notified by Mindy, RN of late decelerations. Patient is currently getting an epidural.  Objective: BP 126/76 (BP Location: Left Arm)   Pulse 60   Temp (!) 97.5 F (36.4 C) (Oral)   Resp 18   Ht '5\' 4"'$  (1.626 m)   Wt 78.1 kg   LMP 04/04/2021   BMI 29.56 kg/m  Notable VS details: reviewed  Fetal Assessment: FHT:  FHR: 145 bpm, variability: moderate,  accelerations:  Abscent,  decelerations:  Present intermittent late decelerations,  Category/reactivity:  Category II UC:   irregular, every 2-6 minutes SVE:    Did not check at this time Membrane status:intact Amniotic color: n/a  Labs: Lab Results  Component Value Date   WBC 10.8 (H) 01/11/2022   HGB 12.4 01/11/2022   HCT 35.6 (L) 01/11/2022   MCV 93.4 01/11/2022   PLT 206 01/11/2022    Assessment / Plan: 31year old G5P1021 at 48w2dere for IOL for elective at term, currently on pitocin  Labor:  Pitocin currently 24m24min. She is sitting up for an epidural. Will recheck cervix, AROM, and place internal monitors after epidural placement. Preeclampsia:  labs stable Fetal Wellbeing:  Category II, will place FSE and IUPC after epidural placement to monitor fetal wellbeing. Currently fetal heart rate decelerations are resolving with position changes. Pain Control:   epidural currently being placed I/D:   GBS negative, not ruptured Anticipated MOD:  NSVD  DanGertie FeyNM 01/11/2022, 1:30 PM

## 2022-01-11 NOTE — Anesthesia Procedure Notes (Signed)
Epidural Patient location during procedure: OB Start time: 01/11/2022 12:18 PM End time: 01/11/2022 12:51 PM  Staffing Anesthesiologist: Dimas Millin, MD Resident/CRNA: Tollie Eth, CRNA Performed: resident/CRNA   Preanesthetic Checklist Completed: patient identified, IV checked, site marked, risks and benefits discussed, surgical consent, monitors and equipment checked, pre-op evaluation and timeout performed  Epidural Patient position: sitting Prep: ChloraPrep Patient monitoring: heart rate, continuous pulse ox and blood pressure Approach: midline Location: L3-L4 Injection technique: LOR saline  Needle:  Needle type: Tuohy  Needle gauge: 17 G Needle length: 9 cm and 9 Needle insertion depth: 6 cm Catheter type: closed end flexible Catheter size: 19 Gauge Catheter at skin depth: 12 cm Test dose: negative and 1.5% lidocaine with Epi 1:200 K  Assessment Sensory level: T10 Events: blood not aspirated, injection not painful, no injection resistance, no paresthesia and negative IV test  Additional Notes 1 attempt Pt. Evaluated and documentation done after procedure finished. Patient identified. Risks/Benefits/Options discussed with patient including but not limited to bleeding, infection, nerve damage, paralysis, failed block, incomplete pain control, headache, blood pressure changes, nausea, vomiting, reactions to medication both or allergic, itching and postpartum back pain. Confirmed with bedside nurse the patient's most recent platelet count. Confirmed with patient that they are not currently taking any anticoagulation, have any bleeding history or any family history of bleeding disorders. Patient expressed understanding and wished to proceed. All questions were answered. Sterile technique was used throughout the entire procedure. Please see nursing notes for vital signs. Test dose was given through epidural catheter and negative prior to continuing to dose epidural or  start infusion. Warning signs of high block given to the patient including shortness of breath, tingling/numbness in hands, complete motor block, or any concerning symptoms with instructions to call for help. Patient was given instructions on fall risk and not to get out of bed. All questions and concerns addressed with instructions to call with any issues or inadequate analgesia.    Patient tolerated the insertion well without immediate complications.Reason for block:procedure for pain

## 2022-01-11 NOTE — Discharge Summary (Addendum)
Obstetrical Discharge Summary  Patient Name: Tina Fischer DOB: 07-09-1990 MRN: [id number]  Date of Admission: 01/11/2022 Date of Delivery: 01/11/22 Delivered by: Lucrezia Europe, CNM Date of Discharge: 01/12/2022  Primary OB: [facility]  DPO:EUMPNTI'R last menstrual period was 04/04/2021. EDC Estimated Date of Delivery: 01/09/22 Gestational Age at Delivery: [redacted]w[redacted]d  Antepartum complications:  1. History of asthma 2. Maternal Mental health dx - Dx: ADHD, GAD 3. size < dates [ u/s 12/24/21 efw= 2895 g= 42 %  Admitting Diagnosis: Elective IOL Secondary Diagnosis: NSVD Patient Active Problem List   Diagnosis Date Noted   NSVD (normal spontaneous vaginal delivery) 01/12/2022   Preventative health care 11/26/2020   GAD (generalized anxiety disorder) 11/26/2020   Rash and nonspecific skin eruption 10/22/2020   Lipoma 02/23/2018   Mild intermittent asthma without complication 044/31/5400  Allergic rhinitis due to allergen 11/16/2017   ADHD 11/16/2017   Migraines 11/16/2016    Augmentation: AROM, Pitocin, and Cytotec Complications: None Intrapartum complications/course: She arrived for induction of labor, received one dose cytotec, then pitocin and AROM. She quickly progressed to 10/100/+3 and pushed one contraction, delivering viable female infant over 1st degree laceration. Apgars 7/9. Date of Delivery: 01/11/2022 Delivered By: DLucrezia Europe CNM Delivery Type: spontaneous vaginal delivery Anesthesia: epidural Placenta: spontaneous Laceration: 1st Episiotomy: none Newborn Data: Live born female "[place] Birth Weight: 7 lb 4.1 oz (3290 g) APGAR: 7, 9  Newborn Delivery   Birth date/time: 01/11/2022 14:27:00 Delivery type: Vaginal, Spontaneous      Postpartum Procedures: none Edinburgh:     05/29/2019    8:50 PM  Edinburgh Postnatal Depression Scale Screening Tool  I have been able to laugh and see the funny side of things. 0  I have looked forward with  enjoyment to things. 0  I have blamed myself unnecessarily when things went wrong. 0  I have been anxious or worried for no good reason. 0  I have felt scared or panicky for no good reason. 0  Things have been getting on top of me. 0  I have been so unhappy that I have had difficulty sleeping. 0  I have felt sad or miserable. 0  I have been so unhappy that I have been crying. 0  The thought of harming myself has occurred to me. 0  Edinburgh Postnatal Depression Scale Total 0     Post partum course:  Patient had an uncomplicated postpartum course.  By time of discharge on PPD#1, her pain was controlled on oral pain medications; she had appropriate lochia and was ambulating, voiding without difficulty and tolerating regular diet.  She was deemed stable for discharge to home.    Discharge Physical Exam:   BP 101/64 (BP Location: Left Arm)   Pulse 65   Temp (!) 97.5 F (36.4 C) (Oral)   Resp 18   Ht '5\' 4"'$  (1.626 m)   Wt 78.1 kg   LMP 04/04/2021   SpO2 98%   BMI 29.56 kg/m   General: NAD CV: RRR Pulm: CTABL, nl effort ABD: s/nd/nt, fundus firm and below the umbilicus Lochia: moderate Perineum: well approximated DVT Evaluation: LE non-ttp, no evidence of DVT on exam.  Hemoglobin  Date Value Ref Range Status  01/12/2022 11.2 (L) 12.0 - 15.0 g/dL Final  03/13/2019 12.3 11.1 - 15.9 g/dL Final   HCT  Date Value Ref Range Status  01/12/2022 33.1 (L) 36.0 - 46.0 % Final   Hematocrit  Date Value Ref Range Status  03/13/2019 35.8  34.0 - 46.6 % Final     Disposition: stable, discharge to home. Baby Feeding: breastmilk Baby Disposition: home with mom  Rh Immune globulin given: n/a, A pos Rubella vaccine given: immune Varicella vaccine given: immune Tdap vaccine given in AP or PP setting: 11/24/21 Flu vaccine given in AP or PP setting: give in season  Contraception: none  Prenatal Labs:  Blood type/Rh A pos  Antibody screen neg  Rubella Immune  Varicella Immune  RPR  NR  HBsAg Neg  HIV NR  GC neg  Chlamydia neg  Genetic screening negative  1 hour GTT 137  3 hour GTT  F83, 100, 82  GBS Neg     Plan:  [name] was discharged to home in good condition. Follow-up appointment with delivering provider in 6 weeks.  Discharge Medications: Allergies as of 01/12/2022   No Known Allergies      Medication List     STOP taking these medications    sertraline 25 MG tablet Commonly known as: ZOLOFT   triamcinolone cream 0.1 % Commonly known as: KENALOG       TAKE these medications    cetirizine 10 MG tablet Commonly known as: ZYRTEC Take 10 mg by mouth daily.   montelukast 10 MG tablet Commonly known as: SINGULAIR TAKE 1 TABLET (10 MG TOTAL) BY MOUTH AT BEDTIME. FOR ALLERGIES AND ASTHMA.   Prenatal (w/Iron & FA) 27-0.8 MG Tabs Take 1 tablet by mouth daily.   ProAir HFA 108 (90 Base) MCG/ACT inhaler Generic drug: albuterol INHALE 2 PUFFS INTO THE LUNGS EVERY 6 HOURS AS NEEDED FOR SHORTNESS OF BREATH         Follow-up Information     Gertie Fey, CNM Follow up in 6 week(s).   Specialty: Certified Nurse Midwife Why: 6wk postpartum Contact information: [place] [address] [phone]         Gertie Fey, CNM Follow up in 2 week(s).   Specialty: Certified Nurse Midwife Why: 2wk mood check Contact information: [place] [address] [phone]                 Signed:  Clydene Laming, CNM 01/12/2022 8:27 AM

## 2022-01-11 NOTE — Progress Notes (Signed)
CNM [place] Notified of prolonged deceleration in Tina Fischer. Deceleration resolved with position change. Provider reviewed strip.

## 2022-01-11 NOTE — Progress Notes (Signed)
CNM notified of late decelerations while on 31m of Pitocin. CNM notified and will be at bedside shortly to evaluate.

## 2022-01-11 NOTE — Progress Notes (Signed)
Labor Progress Note  Tina Fischer is a 31 y.o. G5P1021 at 46w2dby LMP admitted for induction of labor due to Elective at term.  Subjective: She just got an epidural and is becoming more comfortable after epidural placement. Can still feel when she has a contraction.  Objective: BP 126/76 (BP Location: Left Arm)   Pulse 60   Temp (!) 97.5 F (36.4 C) (Oral)   Resp 18   Ht '5\' 4"'$  (1.626 m)   Wt 78.1 kg   LMP 04/04/2021   BMI 29.56 kg/m  Notable VS details: reviewed  Fetal Assessment: FHT:  FHR: 135 bpm, variability: moderate,  accelerations:  Present,  decelerations:  Present recurrent late, early, and variable decelerations Category/reactivity:  Category II UC:   regular, every 2-3 minutes SVE:    Dilation: 5cm  Effacement: 80%  Station:  -1  Consistency: soft  Position: middle  Membrane status:AROM @ 1318 Amniotic color: clear, pink-tinged  Labs: Lab Results  Component Value Date   WBC 10.8 (H) 01/11/2022   HGB 12.4 01/11/2022   HCT 35.6 (L) 01/11/2022   MCV 93.4 01/11/2022   PLT 206 01/11/2022    Assessment / Plan: Induction of labor due to elective at term,  progressing well on pitocin  Labor:  Received one dose of cytotec, now on 425mmin of pitocin. Good cervical change. Preeclampsia:  labs stable Fetal Wellbeing:  Category II,  FSE and IUPC placed, repositioned patient to relieve late decelerations, now having recurrent early decelerations Pain Control:  Epidural I/D:   GBS negative Anticipated MOD:  NSVD  DaGertie FeyCNM 01/11/2022, 1:25 PM

## 2022-01-11 NOTE — Progress Notes (Signed)
CNM [place] Notified of prolonged deceleration in [place] and late decelerations. Position changes resolved decelerations.

## 2022-01-11 NOTE — Anesthesia Preprocedure Evaluation (Signed)
Anesthesia Evaluation  Patient identified by MRN, date of birth, ID band Patient awake    Reviewed: Allergy & Precautions, H&P , NPO status , Patient's Chart, lab work & pertinent test results  Airway Mallampati: II   Neck ROM: full    Dental no notable dental hx.    Pulmonary asthma , former smoker,    Pulmonary exam normal        Cardiovascular negative cardio ROS Normal cardiovascular exam     Neuro/Psych  Headaches, PSYCHIATRIC DISORDERS Anxiety    GI/Hepatic Neg liver ROS, GERD  Controlled,  Endo/Other  negative endocrine ROS  Renal/GU negative Renal ROS  negative genitourinary   Musculoskeletal   Abdominal   Peds  Hematology negative hematology ROS (+)   Anesthesia Other Findings   Reproductive/Obstetrics (+) Pregnancy                             Anesthesia Physical Anesthesia Plan  ASA: 2  Anesthesia Plan: Epidural   Post-op Pain Management:    Induction:   PONV Risk Score and Plan:   Airway Management Planned:   Additional Equipment:   Intra-op Plan:   Post-operative Plan:   Informed Consent:   Plan Discussed with: Anesthesiologist and CRNA  Anesthesia Plan Comments:         Anesthesia Quick Evaluation

## 2022-01-12 LAB — CBC
HCT: 33.1 % — ABNORMAL LOW (ref 36.0–46.0)
Hemoglobin: 11.2 g/dL — ABNORMAL LOW (ref 12.0–15.0)
MCH: 31.9 pg (ref 26.0–34.0)
MCHC: 33.8 g/dL (ref 30.0–36.0)
MCV: 94.3 fL (ref 80.0–100.0)
Platelets: 164 10*3/uL (ref 150–400)
RBC: 3.51 MIL/uL — ABNORMAL LOW (ref 3.87–5.11)
RDW: 12.2 % (ref 11.5–15.5)
WBC: 11.7 10*3/uL — ABNORMAL HIGH (ref 4.0–10.5)
nRBC: 0 % (ref 0.0–0.2)

## 2022-01-12 NOTE — Anesthesia Postprocedure Evaluation (Signed)
Anesthesia Post Note  Patient: Tina Fischer  Procedure(s) Performed: AN AD [place]  Patient location during evaluation: Mother Baby Anesthesia Type: Epidural Level of consciousness: oriented and awake and alert Pain management: pain level controlled Vital Signs Assessment: post-procedure vital signs reviewed and stable Respiratory status: spontaneous breathing and respiratory function stable Cardiovascular status: blood pressure returned to baseline and stable Postop Assessment: no headache, no backache, no apparent nausea or vomiting and able to ambulate Anesthetic complications: no   No notable events documented.   Last Vitals:  Vitals:   01/11/22 2336 01/12/22 0325  BP: 107/64 105/68  Pulse: 78 66  Resp: 18 18  Temp: 36.7 C 36.7 C  SpO2: 99% 99%    Last Pain:  Vitals:   01/12/22 0556  TempSrc:   PainSc: 0-No pain                 Alison Stalling

## 2022-01-12 NOTE — Progress Notes (Signed)
Patient discharged. Discharge instructions given. Patient verbalizes understanding. Transported by axillary. 

## 2022-01-12 NOTE — Lactation Note (Signed)
This note was copied from a baby's chart. Lactation Consultation Note  Patient Name: Tina Fischer [id number] Date: 01/12/2022 Reason for consult: Initial assessment;Term Age:33 hours  Maternal Data Has patient been taught Hand Expression?: Yes Does the patient have breastfeeding experience prior to this delivery?: Yes How long did the patient breastfeed?: 31yr P2, SVD 22 hours ago to full term infant. Provided EBM to first child, now over 33yrold. Plans to continue putting this baby to the breast and pumping. Mom was pre-pumping prior to delivery and receiving up to 3oz/day. She has fed baby regularly and also attempted pumping, obtaining 339mshe was disappointed, however LC provided reassurance that this is normal and that output will continue to increase with milk removal.  Feeding Mother's Current Feeding Choice: Breast Milk  LATCH Score Latch: Too sleepy or reluctant, no latch achieved, no sucking elicited.   Lactation Tools Discussed/Used    Interventions Interventions: Breast feeding basics reviewed;Hand express;Support pillows;Position options;Adjust position;Education (position options and alignment, newborn feeding patterns and early hunger cues, feeding on demand, cluster feeding, output expectations, pumping/bottle feeding around 3-4 weeks)  Mom and LC worked on demonstrating various positions and how to align baby with sandwiching the breast tissue to obtain a deep latch. LC walked mom through positioning in cross-cradle and football hold, mom able to position baby well. We verbally talked through side-lying position as this may be an option once mom is at home.  Discharge Discharge Education: Engorgement and breast care;Warning signs for feeding baby;Outpatient recommendation Pump: Personal;Hands Free (Medela and Elvie)  Briefly discussed management of breast fullness/engorgement, cautioned against too frequent of pumping or over pumping along with BF as this  can increase the symptoms and lead to engorgement. Encouraged to call with questions/concerns.  Consult Status Consult Status: Complete    SarLavonia Drafts/02/2022, 1:13 PM

## 2022-01-19 ENCOUNTER — Other Ambulatory Visit: Payer: Self-pay | Admitting: Primary Care

## 2022-01-19 DIAGNOSIS — F411 Generalized anxiety disorder: Secondary | ICD-10-CM

## 2022-02-03 DIAGNOSIS — N898 Other specified noninflammatory disorders of vagina: Secondary | ICD-10-CM | POA: Diagnosis not present

## 2022-02-28 DIAGNOSIS — N9089 Other specified noninflammatory disorders of vulva and perineum: Secondary | ICD-10-CM | POA: Diagnosis not present

## 2022-02-28 DIAGNOSIS — L918 Other hypertrophic disorders of the skin: Secondary | ICD-10-CM | POA: Diagnosis not present

## 2022-02-28 DIAGNOSIS — Z1332 Encounter for screening for maternal depression: Secondary | ICD-10-CM | POA: Diagnosis not present

## 2022-04-20 ENCOUNTER — Ambulatory Visit: Payer: Medicaid Other | Admitting: Primary Care

## 2022-04-20 ENCOUNTER — Encounter: Payer: Self-pay | Admitting: Primary Care

## 2022-04-20 VITALS — BP 118/58 | HR 74 | Temp 98.0°F | Ht 64.0 in | Wt 150.0 lb

## 2022-04-20 DIAGNOSIS — D229 Melanocytic nevi, unspecified: Secondary | ICD-10-CM | POA: Insufficient documentation

## 2022-04-20 NOTE — Progress Notes (Signed)
Subjective:    Patient ID: Tina Fischer, female    DOB: [birth year], 32 y.o.   MRN: [id number]  HPI  Tina Fischer is a very pleasant 32 y.o. female  has a past medical history of Allergy, Asthma, Asthma during pregnancy (10/10/2018), Frequent headaches, Lipoma, and Migraines. who presents today to discuss nevus.  She noticed a nevus to her right lower back for which she noticed a few months ago. About 1 week ago she noticed the spot become darker and scaly. The nevus feels dry.  She became nervous as she's noticed the nevus become a darker brown. She denies a personal history of skin cancer.    Review of Systems       Past Medical History:  Diagnosis Date   Allergy    Asthma    Asthma during pregnancy 10/10/2018   Frequent headaches    Lipoma    Migraines     Social History   Socioeconomic History   Marital status: Married    Spouse name: Not on file   Number of children: Not on file   Years of education: Not on file   Highest education level: Not on file  Occupational History   Occupation: Unemployed   Occupation: Airline pilot  Tobacco Use   Smoking status: Former    Types: Cigarettes    Quit date: 05/23/2018    Years since quitting: 3.9   Smokeless tobacco: Never  Vaping Use   Vaping Use: Never used  Substance and Sexual Activity   Alcohol use: Yes    Comment: Occ   Drug use: Yes    Types: Marijuana    Comment: Stopped with current pregnancy 2020   Sexual activity: Yes    Birth control/protection: None  Other Topics Concern   Not on file  Social History Narrative   Married.   No children.   Works for [employer], yoga, Chiropodist.    Social Determinants of Health   Financial Resource Strain: Not on file  Food Insecurity: No Food Insecurity (01/11/2022)   Hunger Vital Sign    Worried About Running Out of Food in the Last Year: Never true    Ran Out of Food in the Last Year: Never true  Transportation Needs: No  Transportation Needs (01/11/2022)   PRAPARE - Hydrologist (Medical): No    Lack of Transportation (Non-Medical): No  Physical Activity: Not on file  Stress: Not on file  Social Connections: Not on file  Intimate Partner Violence: Not At Risk (01/11/2022)   Humiliation, Afraid, Rape, and Kick questionnaire    Fear of Current or Ex-Partner: No    Emotionally Abused: No    Physically Abused: No    Sexually Abused: No    Past Surgical History:  Procedure Laterality Date   TONSILLECTOMY AND ADENOIDECTOMY  2008   WISDOM TOOTH EXTRACTION  2017    Family History  Problem Relation Age of Onset   Colon polyps Mother    Cancer Maternal Grandmother 30       breast   Early death Maternal Grandmother    Early death Maternal Grandfather    Depression Paternal Grandmother    Depression Paternal Grandfather    Cancer Paternal Grandfather 80       Prostated    No Known Allergies  Current Outpatient Medications on File Prior to Visit  Medication Sig Dispense Refill   albuterol (PROAIR HFA) 108 (90  Base) MCG/ACT inhaler INHALE 2 PUFFS INTO THE LUNGS EVERY 6 HOURS AS NEEDED FOR SHORTNESS OF BREATH 8.5 each 0   cetirizine (ZYRTEC) 10 MG tablet Take 10 mg by mouth daily.     montelukast (SINGULAIR) 10 MG tablet TAKE 1 TABLET (10 MG TOTAL) BY MOUTH AT BEDTIME. FOR ALLERGIES AND ASTHMA. 90 tablet 2   Prenatal Multivit-Min-Fe-FA (PRENATAL, W/IRON & FA,) 27-0.8 MG TABS Take 1 tablet by mouth daily.     No current facility-administered medications on file prior to visit.    BP (!) 118/58   Pulse 74   Temp 98 F (36.7 C) (Temporal)   Ht '5\' 4"'$  (1.626 m)   Wt 150 lb (68 kg)   SpO2 99%   BMI 25.75 kg/m  Objective:   Physical Exam Skin:    General: Skin is warm and dry.     Comments: 0.5 cm oval shaped, slightly raised nevus to right mid thoracic back. Dark brown. Mild scaling. No jagged edges.            Assessment & Plan:  Nevus Assessment &  Plan: Exam today without suspicion for basal or squamous cell carcinoma or any melanoma.  Reassurance provided.  Discussed to continue monitoring and notify if changes in shape and color (blue/greens/etc.)         Pleas Koch, NP

## 2022-04-20 NOTE — Assessment & Plan Note (Signed)
Exam today without suspicion for basal or squamous cell carcinoma or any melanoma.  Reassurance provided.  Discussed to continue monitoring and notify if changes in shape and color (blue/greens/etc.)

## 2022-05-06 ENCOUNTER — Encounter (INDEPENDENT_AMBULATORY_CARE_PROVIDER_SITE_OTHER): Payer: Medicaid Other

## 2022-05-06 DIAGNOSIS — J3089 Other allergic rhinitis: Secondary | ICD-10-CM

## 2022-05-06 DIAGNOSIS — J4520 Mild intermittent asthma, uncomplicated: Secondary | ICD-10-CM

## 2022-05-10 NOTE — Telephone Encounter (Signed)

## 2022-08-19 DIAGNOSIS — J309 Allergic rhinitis, unspecified: Secondary | ICD-10-CM | POA: Diagnosis not present

## 2022-08-19 DIAGNOSIS — J4530 Mild persistent asthma, uncomplicated: Secondary | ICD-10-CM | POA: Diagnosis not present

## 2022-08-19 DIAGNOSIS — R059 Cough, unspecified: Secondary | ICD-10-CM | POA: Diagnosis not present

## 2022-08-21 NOTE — Telephone Encounter (Signed)
Please schedule an office visit.

## 2022-10-02 ENCOUNTER — Other Ambulatory Visit: Payer: Self-pay | Admitting: Primary Care

## 2022-10-02 DIAGNOSIS — J4520 Mild intermittent asthma, uncomplicated: Secondary | ICD-10-CM

## 2022-10-02 DIAGNOSIS — J3089 Other allergic rhinitis: Secondary | ICD-10-CM

## 2022-10-03 NOTE — Telephone Encounter (Signed)
Patient is due for CPE/follow up in early September, this will be required prior to any further refills.  Please schedule, thank you!

## 2022-10-04 NOTE — Telephone Encounter (Signed)
Patient scheduled.

## 2022-10-08 DIAGNOSIS — S9031XA Contusion of right foot, initial encounter: Secondary | ICD-10-CM | POA: Diagnosis not present

## 2022-10-08 DIAGNOSIS — M79671 Pain in right foot: Secondary | ICD-10-CM | POA: Diagnosis not present

## 2022-11-17 ENCOUNTER — Encounter (INDEPENDENT_AMBULATORY_CARE_PROVIDER_SITE_OTHER): Payer: Self-pay

## 2022-12-14 ENCOUNTER — Encounter: Payer: Self-pay | Admitting: Primary Care

## 2022-12-14 ENCOUNTER — Ambulatory Visit (INDEPENDENT_AMBULATORY_CARE_PROVIDER_SITE_OTHER): Payer: Medicaid Other | Admitting: Primary Care

## 2022-12-14 VITALS — BP 110/64 | HR 70 | Temp 98.2°F | Ht 64.0 in | Wt 141.0 lb

## 2022-12-14 DIAGNOSIS — F411 Generalized anxiety disorder: Secondary | ICD-10-CM

## 2022-12-14 DIAGNOSIS — G43009 Migraine without aura, not intractable, without status migrainosus: Secondary | ICD-10-CM

## 2022-12-14 DIAGNOSIS — J4530 Mild persistent asthma, uncomplicated: Secondary | ICD-10-CM | POA: Diagnosis not present

## 2022-12-14 DIAGNOSIS — Z0000 Encounter for general adult medical examination without abnormal findings: Secondary | ICD-10-CM | POA: Diagnosis not present

## 2022-12-14 MED ORDER — FLUTICASONE PROPIONATE HFA 44 MCG/ACT IN AERO: 2.0000 | INHALATION_SPRAY | Freq: Two times a day (BID) | RESPIRATORY_TRACT | 0 refills | Status: DC

## 2022-12-14 NOTE — Patient Instructions (Signed)
Start fluticasone (Flovent) inhaler for asthma.  Inhale 2 puffs into the lungs twice daily.  Rinse your mouth after each use.  Please update me regarding your breathing.  Stop by the lab prior to leaving today. I will notify you of your results once received.   It was a pleasure to see you today!

## 2022-12-14 NOTE — Assessment & Plan Note (Signed)
Resolved.  No concerns today. Continue to monitor.

## 2022-12-14 NOTE — Progress Notes (Signed)
Subjective:    Patient ID: Tina Fischer, female    DOB: 1990/07/28, 32 y.o.   MRN: [id number]  HPI  Tina Fischer is a very pleasant 32 y.o. female who presents today for complete physical and follow up of chronic conditions.  She continues to experience chest tightness and wheezing most days of the week which began about 1 year ago. She is uses her albuterol 3-5 days weekly.   She would like to try a medication to use as needed for anxiety attacks. Overall anxiety is infrequent and she doesn't to take anything daily.   Immunizations: -Tetanus: Completed in 2023 -Influenza: Declines influenza vaccine.   Diet: Fair diet.  Exercise: No regular exercise.  Eye exam: Completes annually   Dental exam: Completes semi-annually    Pap Smear:  Follows with GYN  BP Readings from Last 3 Encounters:  12/14/22 110/64  04/20/22 (!) 118/58  01/12/22 101/64       Review of Systems  Constitutional:  Negative for unexpected weight change.  HENT:  Negative for rhinorrhea.   Respiratory:  Positive for shortness of breath and wheezing. Negative for cough.   Cardiovascular:  Negative for chest pain.  Gastrointestinal:  Negative for constipation and diarrhea.  Genitourinary:  Negative for difficulty urinating and menstrual problem.  Musculoskeletal:  Negative for arthralgias and myalgias.  Skin:  Negative for rash.  Allergic/Immunologic: Negative for environmental allergies.  Neurological:  Negative for dizziness, numbness and headaches.  Psychiatric/Behavioral:  The patient is nervous/anxious.          Past Medical History:  Diagnosis Date   Allergy    Asthma    Asthma during pregnancy 10/10/2018   Frequent headaches    Lipoma    Migraines    NSVD (normal spontaneous vaginal delivery) 01/12/2022    Social History   Socioeconomic History   Marital status: Married    Spouse name: Not on file   Number of children: Not on file   Years of education: Not on file    Highest education level: Not on file  Occupational History   Occupation: Unemployed   Occupation: Therapist, nutritional  Tobacco Use   Smoking status: Former    Current packs/day: 0.00    Types: Cigarettes    Quit date: 05/23/2018    Years since quitting: 4.5   Smokeless tobacco: Never  Vaping Use   Vaping status: Never Used  Substance and Sexual Activity   Alcohol use: Yes    Comment: Occ   Drug use: Yes    Types: Marijuana    Comment: Stopped with current pregnancy 2020   Sexual activity: Yes    Birth control/protection: None  Other Topics Concern   Not on file  Social History Narrative   Married.   No children.   Works for [employer], yoga, Tax adviser.    Social Determinants of Health   Financial Resource Strain: Not on file  Food Insecurity: No Food Insecurity (01/11/2022)   Hunger Vital Sign    Worried About Running Out of Food in the Last Year: Never true    Ran Out of Food in the Last Year: Never true  Transportation Needs: No Transportation Needs (01/11/2022)   PRAPARE - Administrator, Civil Service (Medical): No    Lack of Transportation (Non-Medical): No  Physical Activity: Not on file  Stress: Not on file  Social Connections: Not on file  Intimate Partner Violence: Not At Risk (  01/11/2022)   Humiliation, Afraid, Rape, and Kick questionnaire    Fear of Current or Ex-Partner: No    Emotionally Abused: No    Physically Abused: No    Sexually Abused: No    Past Surgical History:  Procedure Laterality Date   TONSILLECTOMY AND ADENOIDECTOMY  2008   WISDOM TOOTH EXTRACTION  2017    Family History  Problem Relation Age of Onset   Colon polyps Mother    Cancer Maternal Grandmother 64       breast   Early death Maternal Grandmother    Early death Maternal Grandfather    Depression Paternal Grandmother    Depression Paternal Grandfather    Cancer Paternal Grandfather 67       Prostated    No Known  Allergies  Current Outpatient Medications on File Prior to Visit  Medication Sig Dispense Refill   albuterol (PROAIR HFA) 108 (90 Base) MCG/ACT inhaler INHALE 2 PUFFS INTO THE LUNGS EVERY 6 HOURS AS NEEDED FOR SHORTNESS OF BREATH 8.5 each 0   cetirizine (ZYRTEC) 10 MG tablet Take 10 mg by mouth daily.     montelukast (SINGULAIR) 10 MG tablet TAKE 1 TABLET (10 MG TOTAL) BY MOUTH AT BEDTIME. FOR ALLERGIES AND ASTHMA. 90 tablet 0   Prenatal Multivit-Min-Fe-FA (PRENATAL, W/IRON & FA,) 27-0.8 MG TABS Take 1 tablet by mouth daily. (Patient not taking: Reported on 12/14/2022)     No current facility-administered medications on file prior to visit.    BP 110/64   Pulse 70   Temp 98.2 F (36.8 C) (Temporal)   Ht 5\' 4"  (1.626 m)   Wt 141 lb (64 kg)   LMP 12/05/2022 (Exact Date)   SpO2 98%   BMI 24.20 kg/m  Objective:   Physical Exam HENT:     Right Ear: Tympanic membrane and ear canal normal.     Left Ear: Tympanic membrane and ear canal normal.     Nose: Nose normal.  Eyes:     Conjunctiva/sclera: Conjunctivae normal.     Pupils: Pupils are equal, round, and reactive to light.  Neck:     Thyroid: No thyromegaly.  Cardiovascular:     Rate and Rhythm: Normal rate and regular rhythm.     Heart sounds: No murmur heard. Pulmonary:     Effort: Pulmonary effort is normal.     Breath sounds: Normal breath sounds. No rales.  Abdominal:     General: Bowel sounds are normal.     Palpations: Abdomen is soft.     Tenderness: There is no abdominal tenderness.  Musculoskeletal:        General: Normal range of motion.     Cervical back: Neck supple.  Lymphadenopathy:     Cervical: No cervical adenopathy.  Skin:    General: Skin is warm and dry.     Findings: No rash.  Neurological:     Mental Status: She is alert and oriented to person, place, and time.     Cranial Nerves: No cranial nerve deficit.     Deep Tendon Reflexes: Reflexes are normal and symmetric.  Psychiatric:        Mood  and Affect: Mood normal.           Assessment & Plan:  Preventative health care Assessment & Plan: Immunizations UTD. Declines influenza vaccine. Pap smear UTD. Follows with GYN  Discussed the importance of a healthy diet and regular exercise in order for weight loss, and to reduce the risk of further  co-morbidity.  Exam stable. Labs pending.  Follow up in 1 year for repeat physical.   Orders: -     Lipid panel -     Comprehensive metabolic panel -     Hemoglobin A1c -     CBC  Migraine without aura and without status migrainosus, not intractable Assessment & Plan: Resolved.  No concerns today. Continue to monitor.    Mild persistent asthma without complication Assessment & Plan: Uncontrolled.  Following with allergy and asthma. Inappropriate use of SABA.  Continue Singulair 10 mg HS. Add Flovent 44 mcg 1-2 puff BID. Reviewed breast feeding safety indications which state that fluticasone is considered compatible with breast feeding.  She will update.      Orders: -     Fluticasone Propionate HFA; Inhale 2 puffs into the lungs 2 (two) times daily.  Dispense: 95.4 g; Refill: 0  GAD (generalized anxiety disorder) Assessment & Plan: Overall controlled with intermittent bouts.  Reviewed options, and we did not find an agent that was considered safe with breastfeed. She will update if she changes her mind regarding SSRI treatment.         Doreene Nest, NP

## 2022-12-14 NOTE — Assessment & Plan Note (Signed)
Immunizations UTD. Declines influenza vaccine. Pap smear UTD. Follows with GYN  Discussed the importance of a healthy diet and regular exercise in order for weight loss, and to reduce the risk of further co-morbidity.  Exam stable. Labs pending.  Follow up in 1 year for repeat physical.

## 2022-12-14 NOTE — Assessment & Plan Note (Signed)
Overall controlled with intermittent bouts.  Reviewed options, and we did not find an agent that was considered safe with breastfeed. She will update if she changes her mind regarding SSRI treatment.

## 2022-12-14 NOTE — Assessment & Plan Note (Addendum)
Uncontrolled.  Following with allergy and asthma. Inappropriate use of SABA.  Continue Singulair 10 mg HS. Add Flovent 44 mcg 1-2 puff BID. Reviewed breast feeding safety indications which state that fluticasone is considered compatible with breast feeding.  She will update.

## 2022-12-15 LAB — LIPID PANEL
Cholesterol: 207 mg/dL — ABNORMAL HIGH (ref 0–200)
HDL: 50.8 mg/dL (ref >39.00)
LDL Cholesterol: 134 mg/dL — ABNORMAL HIGH (ref 0–99)
NonHDL: 156.54
Total CHOL/HDL Ratio: 4
Triglycerides: 114 mg/dL (ref 0.0–149.0)
VLDL: 22.8 mg/dL (ref 0.0–40.0)

## 2022-12-15 LAB — CBC
HCT: 42.8 % (ref 36.0–46.0)
Hemoglobin: 14.3 g/dL (ref 12.0–15.0)
MCHC: 33.4 g/dL (ref 30.0–36.0)
MCV: 94.8 fl (ref 78.0–100.0)
Platelets: 302 10*3/uL (ref 150.0–400.0)
RBC: 4.52 Mil/uL (ref 3.87–5.11)
RDW: 12.4 % (ref 11.5–15.5)
WBC: 9.6 10*3/uL (ref 4.0–10.5)

## 2022-12-15 LAB — COMPREHENSIVE METABOLIC PANEL
ALT: 14 U/L (ref 0–35)
AST: 17 U/L (ref 0–37)
Albumin: 4.3 g/dL (ref 3.5–5.2)
Alkaline Phosphatase: 72 U/L (ref 39–117)
BUN: 8 mg/dL (ref 6–23)
CO2: 25 meq/L (ref 19–32)
Calcium: 9.9 mg/dL (ref 8.4–10.5)
Chloride: 102 meq/L (ref 96–112)
Creatinine, Ser: 0.67 mg/dL (ref 0.40–1.20)
GFR: 114.4 mL/min (ref >60.00)
Glucose, Bld: 84 mg/dL (ref 70–99)
Potassium: 4.1 meq/L (ref 3.5–5.1)
Sodium: 138 meq/L (ref 135–145)
Total Bilirubin: 0.8 mg/dL (ref 0.2–1.2)
Total Protein: 6.7 g/dL (ref 6.0–8.3)

## 2022-12-15 LAB — HEMOGLOBIN A1C: Hgb A1c MFr Bld: 5.2 % (ref 4.6–6.5)

## 2022-12-24 ENCOUNTER — Other Ambulatory Visit: Payer: Self-pay | Admitting: Primary Care

## 2022-12-24 DIAGNOSIS — J3089 Other allergic rhinitis: Secondary | ICD-10-CM

## 2022-12-24 DIAGNOSIS — J4520 Mild intermittent asthma, uncomplicated: Secondary | ICD-10-CM

## 2023-02-01 DIAGNOSIS — Z3201 Encounter for pregnancy test, result positive: Secondary | ICD-10-CM | POA: Diagnosis not present

## 2023-02-01 DIAGNOSIS — N912 Amenorrhea, unspecified: Secondary | ICD-10-CM | POA: Diagnosis not present

## 2023-02-28 ENCOUNTER — Ambulatory Visit: Payer: Medicaid Other | Admitting: Internal Medicine

## 2023-02-28 ENCOUNTER — Encounter: Payer: Self-pay | Admitting: Internal Medicine

## 2023-02-28 VITALS — BP 102/58 | HR 101 | Temp 98.3°F | Ht 64.0 in | Wt 141.0 lb

## 2023-02-28 DIAGNOSIS — B349 Viral infection, unspecified: Secondary | ICD-10-CM | POA: Insufficient documentation

## 2023-02-28 LAB — POC COVID19 BINAXNOW: SARS Coronavirus 2 Ag: NEGATIVE

## 2023-02-28 NOTE — Progress Notes (Signed)
Subjective:    Patient ID: Tina Fischer, female    DOB: [birth year], 32 y.o.   MRN: [id number]  HPI Here due to respiratory illness  Started 2 days ago Started with head and back pain--thought it was related to missing dinner Back pain worse with lying down  Body aches, chills, headache, chest tightness--had to use inhaler About the same yesterday--?slightly better but then worsened  Some cough--not too bad Some SOB--like walking around. Breathes faster prn Some sore throat--not bad No ear pain  Has tried tylenol--helps some  Current Outpatient Medications on File Prior to Visit  Medication Sig Dispense Refill   albuterol (PROAIR HFA) 108 (90 Base) MCG/ACT inhaler INHALE 2 PUFFS INTO THE LUNGS EVERY 6 HOURS AS NEEDED FOR SHORTNESS OF BREATH 8.5 each 0   cetirizine (ZYRTEC) 10 MG tablet Take 10 mg by mouth daily.     montelukast (SINGULAIR) 10 MG tablet TAKE 1 TABLET (10 MG TOTAL) BY MOUTH AT BEDTIME. FOR ALLERGIES AND ASTHMA. 90 tablet 3   Prenatal Multivit-Min-Fe-FA (PRENATAL, W/IRON & FA,) 27-0.8 MG TABS Take 1 tablet by mouth daily.     fluticasone (FLOVENT HFA) 44 MCG/ACT inhaler Inhale 2 puffs into the lungs 2 (two) times daily. (Patient not taking: Reported on 02/28/2023) 95.4 g 0   No current facility-administered medications on file prior to visit.    No Known Allergies  Past Medical History:  Diagnosis Date   Allergy    Asthma    Asthma during pregnancy 10/10/2018   Frequent headaches    Lipoma    Migraines    NSVD (normal spontaneous vaginal delivery) 01/12/2022    Past Surgical History:  Procedure Laterality Date   TONSILLECTOMY AND ADENOIDECTOMY  2008   WISDOM TOOTH EXTRACTION  2017    Family History  Problem Relation Age of Onset   Colon polyps Mother    Cancer Maternal Grandmother 48       breast   Early death Maternal Grandmother    Early death Maternal Grandfather    Depression Paternal Grandmother    Depression Paternal Grandfather     Cancer Paternal Grandfather 50       Prostated    Social History   Socioeconomic History   Marital status: Married    Spouse name: Not on file   Number of children: Not on file   Years of education: Not on file   Highest education level: Not on file  Occupational History   Occupation: Unemployed   Occupation: Therapist, nutritional  Tobacco Use   Smoking status: Former    Current packs/day: 0.00    Types: Cigarettes    Quit date: 05/23/2018    Years since quitting: 4.7   Smokeless tobacco: Never  Vaping Use   Vaping status: Never Used  Substance and Sexual Activity   Alcohol use: Yes    Comment: Occ   Drug use: Yes    Types: Marijuana    Comment: Stopped with current pregnancy 2020   Sexual activity: Yes    Birth control/protection: None  Other Topics Concern   Not on file  Social History Narrative   Married.   No children.   Works for [employer], yoga, Tax adviser.    Social Determinants of Health   Financial Resource Strain: Not on file  Food Insecurity: No Food Insecurity (01/11/2022)   Hunger Vital Sign    Worried About Running Out of Food in the Last Year: Never true  Ran Out of Food in the Last Year: Never true  Transportation Needs: No Transportation Needs (01/11/2022)   PRAPARE - Administrator, Civil Service (Medical): No    Lack of Transportation (Non-Medical): No  Physical Activity: Not on file  Stress: Not on file  Social Connections: Not on file  Intimate Partner Violence: Not At Risk (01/11/2022)   Humiliation, Afraid, Rape, and Kick questionnaire    Fear of Current or Ex-Partner: No    Emotionally Abused: No    Physically Abused: No    Sexually Abused: No   Review of Systems Some nausea --likely pregnancy related (12 weeks now) Appetite is off--but is able to eat    Objective:   Physical Exam Constitutional:      Appearance: Normal appearance.  HENT:     Head:     Comments: No sinus tenderness     Right Ear: Tympanic membrane and ear canal normal.     Left Ear: Tympanic membrane and ear canal normal.     Mouth/Throat:     Pharynx: No oropharyngeal exudate or posterior oropharyngeal erythema.  Pulmonary:     Effort: Pulmonary effort is normal.     Breath sounds: Normal breath sounds. No wheezing or rales.  Abdominal:     Palpations: Abdomen is soft.     Tenderness: There is no abdominal tenderness.  Musculoskeletal:     Cervical back: Neck supple.  Lymphadenopathy:     Cervical: No cervical adenopathy.  Neurological:     Mental Status: She is alert.            Assessment & Plan:

## 2023-02-28 NOTE — Addendum Note (Signed)
Addended by: Eual Fines on: 02/28/2023 12:47 PM   Modules accepted: Orders

## 2023-02-28 NOTE — Assessment & Plan Note (Signed)
COVID test negative here Unlikely flu --but wouldn't use tamilfu Discussed symptom relief--she is concerned about even tylenol (so will moderate using that) Going to [facility] tomorrow

## 2023-03-01 ENCOUNTER — Telehealth: Payer: Self-pay | Admitting: Primary Care

## 2023-03-01 DIAGNOSIS — J4520 Mild intermittent asthma, uncomplicated: Secondary | ICD-10-CM

## 2023-03-01 MED ORDER — ALBUTEROL SULFATE HFA 108 (90 BASE) MCG/ACT IN AERS: 1.0000 | INHALATION_SPRAY | Freq: Four times a day (QID) | RESPIRATORY_TRACT | 0 refills | Status: AC | PRN

## 2023-03-01 NOTE — Telephone Encounter (Signed)
Prescription Request  03/01/2023  LOV: 12/14/2022  What is the name of the medication or equipment? albuterol (PROAIR HFA) 108 (90 Base) MCG/ACT inhaler   Have you contacted your pharmacy to request a refill? Yes   Which pharmacy would you like this sent to?  [facility] [address] [place] - [address] [place] [address] Phone: [phone] Fax: [phone]    Patient notified that their request is being sent to the clinical staff for review and that they should receive a response within 2 business days.   Please advise at Mobile [phone] (mobile)

## 2023-03-01 NOTE — Telephone Encounter (Signed)
Refills sent to pharmacy. 

## 2023-03-15 DIAGNOSIS — F419 Anxiety disorder, unspecified: Secondary | ICD-10-CM | POA: Diagnosis not present

## 2023-03-15 DIAGNOSIS — Z3689 Encounter for other specified antenatal screening: Secondary | ICD-10-CM | POA: Diagnosis not present

## 2023-03-15 DIAGNOSIS — J45909 Unspecified asthma, uncomplicated: Secondary | ICD-10-CM | POA: Diagnosis not present

## 2023-03-15 DIAGNOSIS — Z3482 Encounter for supervision of other normal pregnancy, second trimester: Secondary | ICD-10-CM | POA: Diagnosis not present

## 2023-03-15 LAB — OB RESULTS CONSOLE HEPATITIS B SURFACE ANTIGEN: Hepatitis B Surface Ag: NEGATIVE

## 2023-03-15 LAB — OB RESULTS CONSOLE RUBELLA ANTIBODY, IGM: Rubella: IMMUNE

## 2023-03-15 LAB — OB RESULTS CONSOLE HIV ANTIBODY (ROUTINE TESTING): HIV: NONREACTIVE

## 2023-03-15 LAB — OB RESULTS CONSOLE GC/CHLAMYDIA
Chlamydia: NEGATIVE
Neisseria Gonorrhea: NEGATIVE

## 2023-03-15 LAB — OB RESULTS CONSOLE VARICELLA ZOSTER ANTIBODY, IGG: Varicella: IMMUNE

## 2023-03-18 ENCOUNTER — Other Ambulatory Visit: Payer: Self-pay | Admitting: Primary Care

## 2023-03-18 DIAGNOSIS — J4520 Mild intermittent asthma, uncomplicated: Secondary | ICD-10-CM

## 2023-03-18 NOTE — Telephone Encounter (Signed)
Please call patient:  Received refill request for albuterol (rescue) inhaler. Does she actually need a refill or is this on autofill? We just sent one less than 1 month ago to her pharmacy.   How is her asthma? Is she using the Flovent inhaler?

## 2023-03-20 MED ORDER — QVAR REDIHALER 40 MCG/ACT IN AERB: 2.0000 | INHALATION_SPRAY | Freq: Two times a day (BID) | RESPIRATORY_TRACT | 0 refills | Status: AC

## 2023-03-20 NOTE — Telephone Encounter (Signed)
Noted.  Please notify her that I sent a prescription for Qvar inhaler to her pharmacy. See if this is better. Inhale 2 puffs into the lungs twice daily. Have her call us back if she cannot tolerate it.

## 2023-03-20 NOTE — Telephone Encounter (Signed)
Called and spoke with patient, she does not need a refill on her albuterol inhaler, this was an automated refill request.   She has not been using the Flovent inhaler, she does not like the powder inhalation. She would like to see if there is another option that is different. She is currently pregnant and thinks this is the reason she doesn't prefer the Flovent. Patient is having to use albuterol inhaler every other day.

## 2023-03-21 NOTE — Telephone Encounter (Signed)
Called patient and reviewed all information. Patient verbalized understanding. Will call if any further questions.  

## 2023-04-05 NOTE — L&D Delivery Note (Signed)
 Delivery Note  Tina Fischer is a [id number] at [redacted]w[redacted]d with an LMP of 12/05/2022, consistent with US  at [redacted]w[redacted]d.   First Stage: Labor onset: unable to recall  Augmentation: IV Pitocin   Analgesia /Anesthesia intrapartum: regional anesthesia (epidural) SROM at 1631, clear  GBS: negative IP Antibiotics: None   Second Stage: Complete dilation at 1600 Onset of pushing at 1624 FHR second stage 125 bpm with moderate variability, variable decels with pushing, reassured by moderate varibility *Broken tracing due to fetal and maternal movement when pushing  [name] presented to L&D for eIOL. She was augmented with IV Pitocin . She progressed  to 10/100/+1 with a spontaneous urge to push.  She pushed  effectively over approximately 6 minutes for a spontaneous vaginal birth.  Delivery of a viable baby girl on 09/01/2023 at 1631 by CNM Delivery of fetal head in ROA position with restitution to ROT. Nuchal cord x 1 around neck. Nuchal managed via Somersault delivery; Anterior then posterior shoulders delivered easily with gentle downward traction. Baby placed on mom's chest, and attended to by baby RN Cord double clamped after cessation of pulsation, cut by FOB  Cord blood sample collection: Not Indicated A POS Collection of cord blood donation:None  Arterial cord blood sample: None   Third Stage: Oxytocin  bolus started after delivery of infant for hemorrhage prophylaxis  Placenta delivered Ileana Mallard intact with 3 VC @ 1639 Placenta disposition: home with patient; waiver to be signed  Uterine tone firm / bleeding minimal IV Pitocin  for hemorrhage prophylaxis  1st perineal laceration identified  Anesthesia used for repair: Regional anesthesia (epidural) Suture Repair: 2-0 vicryl Ct-1 Est. Blood Loss (mL): 50 mL  Complications: None   Mom to postpartum.  Baby to Couplet care / Skin to Skin.  Newborn: Information for the patient's newborn:  [name], [name] [[id number]]  Live born female   Birth Weight:  Pending  APGAR: 8, 9  Newborn Delivery   Birth date/time: 09/01/2023 16:31:00 Delivery type: Vaginal, Spontaneous       Feeding planned: breast feeding  ---------- Tayja Manzer CNM Certified Nurse Midwife [facility]

## 2023-04-11 ENCOUNTER — Encounter: Payer: Self-pay | Admitting: Family

## 2023-04-11 ENCOUNTER — Ambulatory Visit: Payer: Medicaid Other | Admitting: Family

## 2023-04-11 VITALS — BP 130/70 | HR 107 | Temp 99.3°F

## 2023-04-11 DIAGNOSIS — J40 Bronchitis, not specified as acute or chronic: Secondary | ICD-10-CM | POA: Diagnosis not present

## 2023-04-11 MED ORDER — AMOXICILLIN-POT CLAVULANATE 875-125 MG PO TABS: 1.0000 | ORAL_TABLET | Freq: Two times a day (BID) | ORAL | 0 refills | Status: DC

## 2023-04-11 NOTE — Progress Notes (Signed)
 Established Patient Office Visit  Subjective:   Patient ID: Tina Fischer, female    DOB: May 31, 1990  Age: 33 y.o. MRN: [id number]  CC:  Chief Complaint  Patient presents with   Acute Visit    Reports that she is still not feeling well. [redacted] weeks pregnant. Reports cough, wheezing, shortness of breath, sinus congestion. Saw Dr. Jimmy back in November and he tested her for COVID and it was negative.    HPI: [name] is a 33 y.o. female presenting on 04/11/2023 for Acute Visit (Reports that she is still not feeling well. [redacted] weeks pregnant. Reports cough, wheezing, shortness of breath, sinus congestion. Saw Dr. Jimmy back in November and he tested her for COVID and it was negative.)  Ongoing cough, stuffy nose, post nasal drip, easily winded, sob.  Lower back pain mostly on her right side.   Cough is dry at times, and other times thick and green without productive cough.   Was seen back in november with Dr. Jimmy, covid negative. Suspected to be viral.  Slight improvement with chills, body aches, but then came back again new years eve. She is using her albuterol , about two puffs twice daily. She does note wheezing on and off. Did see Ob 12/11 but was told she was probably having viral infection. She does take singulair  and also zyrtec.   She is [redacted] weeks pregnant.         ROS: Negative unless specifically indicated above in HPI.   Relevant past medical history reviewed and updated as indicated.   Allergies and medications reviewed and updated.   Current Outpatient Medications:    albuterol  (PROAIR  HFA) 108 (90 Base) MCG/ACT inhaler, Inhale 1-2 puffs into the lungs every 6 (six) hours as needed for wheezing or shortness of breath., Disp: 8.5 each, Rfl: 0   amoxicillin -clavulanate (AUGMENTIN ) 875-125 MG tablet, Take 1 tablet by mouth 2 (two) times daily., Disp: 20 tablet, Rfl: 0   cetirizine (ZYRTEC) 10 MG tablet, Take 10 mg by mouth daily., Disp: , Rfl:    montelukast   (SINGULAIR ) 10 MG tablet, TAKE 1 TABLET (10 MG TOTAL) BY MOUTH AT BEDTIME. FOR ALLERGIES AND ASTHMA., Disp: 90 tablet, Rfl: 3   Prenatal Multivit-Min-Fe-FA (PRENATAL, W/IRON & FA,) 27-0.8 MG TABS, Take 1 tablet by mouth daily., Disp: , Rfl:    beclomethasone (QVAR  REDIHALER) 40 MCG/ACT inhaler, Inhale 2 puffs into the lungs 2 (two) times daily. (Patient not taking: Reported on 04/11/2023), Disp: 1 each, Rfl: 0  No Known Allergies  Objective:   BP 130/70 (BP Location: Left Arm, Patient Position: Sitting, Cuff Size: Normal)   Pulse (!) 107   Temp 99.3 F (37.4 C) (Temporal)   SpO2 97%    Physical Exam Constitutional:      General: She is not in acute distress.    Appearance: Normal appearance. She is normal weight. She is not ill-appearing, toxic-appearing or diaphoretic.  HENT:     Head: Normocephalic.     Right Ear: Tympanic membrane normal.     Left Ear: Tympanic membrane normal.     Nose: Mucosal edema and rhinorrhea present.     Mouth/Throat:     Mouth: Mucous membranes are dry.     Pharynx: Posterior oropharyngeal erythema present. No oropharyngeal exudate.  Eyes:     Extraocular Movements: Extraocular movements intact.     Pupils: Pupils are equal, round, and reactive to light.  Cardiovascular:     Rate and Rhythm: Normal rate and  regular rhythm.     Pulses: Normal pulses.     Heart sounds: Normal heart sounds.  Pulmonary:     Effort: Pulmonary effort is normal.     Breath sounds: No decreased air movement. Examination of the right-upper field reveals rhonchi. Examination of the right-lower field reveals rhonchi. Wheezing (expiratory) and rhonchi present.  Musculoskeletal:     Cervical back: Normal range of motion.  Neurological:     General: No focal deficit present.     Mental Status: She is alert and oriented to person, place, and time. Mental status is at baseline.  Psychiatric:        Mood and Affect: Mood normal.        Behavior: Behavior normal.        Thought  Content: Thought content normal.        Judgment: Judgment normal.     Assessment & Plan:  Bronchitis Assessment & Plan: Concerning for pneumonia.  Pt pregnant so will defer CXR. rx augmentin  875/125 mg po bid x 10 days, if no improvement in next few days can consider adding on Zpack. Can not do doxycycline due to pregnancy.  Recommendation for otc mucinex (without DM) Take antibiotic as prescribed. Increase oral fluids. Pt to f/u if sx worsen and or fail to improve in 2-3 days.   Orders: -     Amoxicillin -Pot Clavulanate; Take 1 tablet by mouth 2 (two) times daily.  Dispense: 20 tablet; Refill: 0     Follow up plan: Return in about 1 week (around 04/18/2023) for follow up with pcp for possible pneumonia if no improvement.  Tina Patrick, FNP

## 2023-04-12 DIAGNOSIS — J40 Bronchitis, not specified as acute or chronic: Secondary | ICD-10-CM | POA: Insufficient documentation

## 2023-04-12 NOTE — Assessment & Plan Note (Signed)
 Concerning for pneumonia.  Pt pregnant so will defer CXR. rx augmentin  875/125 mg po bid x 10 days, if no improvement in next few days can consider adding on Zpack. Can not do doxycycline due to pregnancy.  Recommendation for otc mucinex (without DM) Take antibiotic as prescribed. Increase oral fluids. Pt to f/u if sx worsen and or fail to improve in 2-3 days.

## 2023-04-22 DIAGNOSIS — Z3482 Encounter for supervision of other normal pregnancy, second trimester: Secondary | ICD-10-CM | POA: Diagnosis not present

## 2023-04-22 DIAGNOSIS — Z3689 Encounter for other specified antenatal screening: Secondary | ICD-10-CM | POA: Diagnosis not present

## 2023-06-20 DIAGNOSIS — Z23 Encounter for immunization: Secondary | ICD-10-CM | POA: Diagnosis not present

## 2023-06-20 DIAGNOSIS — Z3482 Encounter for supervision of other normal pregnancy, second trimester: Secondary | ICD-10-CM | POA: Diagnosis not present

## 2023-06-20 LAB — OB RESULTS CONSOLE RPR: RPR: NONREACTIVE

## 2023-08-07 DIAGNOSIS — Z3A35 35 weeks gestation of pregnancy: Secondary | ICD-10-CM | POA: Diagnosis not present

## 2023-08-07 DIAGNOSIS — Z3483 Encounter for supervision of other normal pregnancy, third trimester: Secondary | ICD-10-CM | POA: Diagnosis not present

## 2023-08-07 LAB — OB RESULTS CONSOLE GC/CHLAMYDIA
Chlamydia: NEGATIVE
Neisseria Gonorrhea: NEGATIVE

## 2023-08-07 LAB — OB RESULTS CONSOLE GBS: GBS: NEGATIVE

## 2023-08-29 ENCOUNTER — Encounter: Payer: Self-pay | Admitting: Obstetrics and Gynecology

## 2023-08-29 ENCOUNTER — Observation Stay
Admission: EM | Admit: 2023-08-29 | Discharge: 2023-08-29 | Disposition: A | Discharge status: Home / Self Care | Attending: Obstetrics and Gynecology | Admitting: Obstetrics and Gynecology

## 2023-08-29 DIAGNOSIS — O99513 Diseases of the respiratory system complicating pregnancy, third trimester: Secondary | ICD-10-CM | POA: Diagnosis not present

## 2023-08-29 DIAGNOSIS — J45909 Unspecified asthma, uncomplicated: Secondary | ICD-10-CM | POA: Diagnosis not present

## 2023-08-29 DIAGNOSIS — Z79899 Other long term (current) drug therapy: Secondary | ICD-10-CM | POA: Diagnosis not present

## 2023-08-29 DIAGNOSIS — O471 False labor at or after 37 completed weeks of gestation: Principal | ICD-10-CM | POA: Insufficient documentation

## 2023-08-29 DIAGNOSIS — O479 False labor, unspecified: Principal | ICD-10-CM | POA: Diagnosis present

## 2023-08-29 DIAGNOSIS — O99891 Other specified diseases and conditions complicating pregnancy: Secondary | ICD-10-CM | POA: Diagnosis not present

## 2023-08-29 DIAGNOSIS — Z3A38 38 weeks gestation of pregnancy: Secondary | ICD-10-CM | POA: Insufficient documentation

## 2023-08-29 DIAGNOSIS — Z87891 Personal history of nicotine dependence: Secondary | ICD-10-CM | POA: Insufficient documentation

## 2023-08-29 MED ORDER — CALCIUM CARBONATE ANTACID 500 MG PO CHEW: 2.0000 | CHEWABLE_TABLET | ORAL | Status: DC | PRN

## 2023-08-29 MED ORDER — ACETAMINOPHEN 325 MG PO TABS: 650.0000 mg | ORAL_TABLET | ORAL | Status: DC | PRN

## 2023-08-29 NOTE — Discharge Summary (Signed)
 Tina Fischer is a 32 y.o. female. She is at [redacted]w[redacted]d gestation. Patient's last menstrual period was 12/05/2022 (exact date). Estimated Date of Delivery: 09/07/23  Prenatal care site: [facility]   Current pregnancy complicated by:  - ADHD and anxiety - asthma  Chief complaint: uterine contractions  She reports uterine contractions that have been present the last several days and have gotten slightly stronger tonight. She is not breathing through the contractions. She reports they will happen for several hours then become less intense.  S: Resting comfortably. no CTX, no VB.no LOF,  Active fetal movement.  Denies: HA, visual changes, SOB, or RUQ/epigastric pain  Maternal Medical History:   Past Medical History:  Diagnosis Date   Allergy    Asthma    Asthma during pregnancy 10/10/2018   Frequent headaches    Lipoma    Migraines    NSVD (normal spontaneous vaginal delivery) 01/12/2022    Past Surgical History:  Procedure Laterality Date   TONSILLECTOMY AND ADENOIDECTOMY  2008   WISDOM TOOTH EXTRACTION  2017    No Known Allergies  Prior to Admission medications   Medication Sig Start Date End Date Taking? Authorizing Provider  cetirizine (ZYRTEC) 10 MG tablet Take 10 mg by mouth daily.   Yes [provider]  montelukast  (SINGULAIR ) 10 MG tablet TAKE 1 TABLET (10 MG TOTAL) BY MOUTH AT BEDTIME. FOR ALLERGIES AND ASTHMA. 12/25/22  Yes Clark, Katherine K, NP  Prenatal Multivit-Min-Fe-FA (PRENATAL, W/IRON & FA,) 27-0.8 MG TABS Take 1 tablet by mouth daily.   Yes [provider]  albuterol  (PROAIR  HFA) 108 (90 Base) MCG/ACT inhaler Inhale 1-2 puffs into the lungs every 6 (six) hours as needed for wheezing or shortness of breath. 03/01/23   Clark, Katherine K, NP  amoxicillin -clavulanate (AUGMENTIN ) 875-125 MG tablet Take 1 tablet by mouth 2 (two) times daily. 04/11/23   Dugal, Tabitha, FNP  beclomethasone (QVAR  REDIHALER) 40 MCG/ACT inhaler Inhale 2 puffs  into the lungs 2 (two) times daily. Patient not taking: Reported on 04/11/2023 03/20/23   Gabriel John, NP    Social History: She  reports that she quit smoking about 5 years ago. Her smoking use included cigarettes. She has never used smokeless tobacco. She reports current alcohol use. She reports that she does not currently use drugs after having used the following drugs: Marijuana.  Family History: family history includes Cancer (age of onset: 26) in her maternal grandmother; Cancer (age of onset: 2) in her paternal grandfather; Colon polyps in her mother; Depression in her paternal grandfather and paternal grandmother; Early death in her maternal grandfather and maternal grandmother.  no history of gyn cancers  Review of Systems: A full review of systems was performed and negative except as noted in the HPI.     O:  BP 112/60   Pulse 74   Temp 98.2 F (36.8 C) (Oral)   Resp 16   LMP 12/05/2022 (Exact Date)  No results found for this or any previous visit (from the past 48 hours).   Constitutional: NAD, AAOx3  HE/ENT: extraocular movements grossly intact, moist mucous membranes CV: RRR PULM: nl respiratory effort, CTABL     Abd: gravid, non-tender, non-distended, soft      Ext: Non-tender, Nonedematous   Psych: mood appropriate, speech normal Pelvic: 4/50/-2  Pelvic exam: normal external genitalia, vulva, vagina, cervix, uterus and adnexa.  Fetal  monitoring: Cat 1 Appropriate for GA Baseline: 135bpm Variability: moderate Accelerations: present x >2 Decelerations absent Time  Contractions: q3-83min, palpate mild  A/P: 33 y.o. [redacted]w[redacted]d here for antenatal surveillance for uterine contractions/false labor  Principle Diagnosis:  Uterine Contractions, false labor  Labor: not present. Discussed waiting 2 hours and rechecking her but she declines, states her contractions have stopped and become less intense, and wants to be discharged and will return if her contractions  get stronger. Reviewed strict labor precautions. Fetal Wellbeing: Reassuring Cat 1 tracing. Reactive NST  D/c home stable, precautions reviewed, follow-up as scheduled.    Auston Left, CNM 08/29/2023 8:43 PM

## 2023-08-31 ENCOUNTER — Other Ambulatory Visit: Payer: Self-pay | Admitting: Obstetrics and Gynecology

## 2023-08-31 DIAGNOSIS — Z3490 Encounter for supervision of normal pregnancy, unspecified, unspecified trimester: Secondary | ICD-10-CM

## 2023-09-01 ENCOUNTER — Encounter: Payer: Self-pay | Admitting: Obstetrics and Gynecology

## 2023-09-01 ENCOUNTER — Inpatient Hospital Stay
Admission: RE | Admit: 2023-09-01 | Discharge: 2023-09-02 | DRG: 807 | Disposition: A | Discharge status: Home / Self Care | Source: Ambulatory Visit | Attending: Certified Nurse Midwife | Admitting: Certified Nurse Midwife

## 2023-09-01 ENCOUNTER — Inpatient Hospital Stay: Admitting: Anesthesiology

## 2023-09-01 ENCOUNTER — Other Ambulatory Visit: Payer: Self-pay

## 2023-09-01 DIAGNOSIS — F419 Anxiety disorder, unspecified: Secondary | ICD-10-CM | POA: Diagnosis not present

## 2023-09-01 DIAGNOSIS — K219 Gastro-esophageal reflux disease without esophagitis: Secondary | ICD-10-CM | POA: Diagnosis present

## 2023-09-01 DIAGNOSIS — O9962 Diseases of the digestive system complicating childbirth: Secondary | ICD-10-CM | POA: Diagnosis present

## 2023-09-01 DIAGNOSIS — J4530 Mild persistent asthma, uncomplicated: Secondary | ICD-10-CM | POA: Diagnosis not present

## 2023-09-01 DIAGNOSIS — O26893 Other specified pregnancy related conditions, third trimester: Secondary | ICD-10-CM | POA: Diagnosis not present

## 2023-09-01 DIAGNOSIS — O9952 Diseases of the respiratory system complicating childbirth: Secondary | ICD-10-CM | POA: Diagnosis not present

## 2023-09-01 DIAGNOSIS — O09293 Supervision of pregnancy with other poor reproductive or obstetric history, third trimester: Secondary | ICD-10-CM | POA: Diagnosis not present

## 2023-09-01 DIAGNOSIS — Z87891 Personal history of nicotine dependence: Secondary | ICD-10-CM | POA: Diagnosis not present

## 2023-09-01 DIAGNOSIS — Z3A39 39 weeks gestation of pregnancy: Secondary | ICD-10-CM | POA: Diagnosis not present

## 2023-09-01 DIAGNOSIS — Z3490 Encounter for supervision of normal pregnancy, unspecified, unspecified trimester: Principal | ICD-10-CM | POA: Diagnosis present

## 2023-09-01 LAB — TYPE AND SCREEN
ABO/RH(D): A POS
Antibody Screen: NEGATIVE

## 2023-09-01 LAB — CBC
HCT: 35 % — ABNORMAL LOW (ref 36.0–46.0)
Hemoglobin: 12.7 g/dL (ref 12.0–15.0)
MCH: 33.3 pg (ref 26.0–34.0)
MCHC: 36.3 g/dL — ABNORMAL HIGH (ref 30.0–36.0)
MCV: 91.9 fL (ref 80.0–100.0)
Platelets: 221 10*3/uL (ref 150–400)
RBC: 3.81 MIL/uL — ABNORMAL LOW (ref 3.87–5.11)
RDW: 13.2 % (ref 11.5–15.5)
WBC: 11.1 10*3/uL — ABNORMAL HIGH (ref 4.0–10.5)
nRBC: 0 % (ref 0.0–0.2)

## 2023-09-01 MED ORDER — LIDOCAINE HCL (PF) 1 % IJ SOLN
INTRAMUSCULAR | Status: DC | PRN
  Administered 2023-09-01: 2 mL via INTRADERMAL

## 2023-09-01 MED ORDER — EPHEDRINE 5 MG/ML INJ: 10.0000 mg | INTRAVENOUS | Status: DC | PRN

## 2023-09-01 MED ORDER — ACETAMINOPHEN 325 MG PO TABS: 650.0000 mg | ORAL_TABLET | ORAL | Status: DC | PRN

## 2023-09-01 MED ORDER — LIDOCAINE-EPINEPHRINE (PF) 1.5 %-1:200000 IJ SOLN
INTRAMUSCULAR | Status: DC | PRN
  Administered 2023-09-01: 3 mL via EPIDURAL

## 2023-09-01 MED ORDER — PRENATAL MULTIVITAMIN CH: 1.0000 | ORAL_TABLET | Freq: Every day | ORAL | Status: DC

## 2023-09-01 MED ORDER — SODIUM CHLORIDE 0.9% FLUSH: 3.0000 mL | INTRAVENOUS | Status: DC | PRN

## 2023-09-01 MED ORDER — LIDOCAINE HCL (PF) 1 % IJ SOLN: 30.0000 mL | INTRAMUSCULAR | Status: DC | PRN

## 2023-09-01 MED ORDER — AMMONIA AROMATIC IN INHA
RESPIRATORY_TRACT | Status: AC
  Filled 2023-09-01: qty 10

## 2023-09-01 MED ORDER — ONDANSETRON HCL 4 MG PO TABS: 4.0000 mg | ORAL_TABLET | ORAL | Status: DC | PRN

## 2023-09-01 MED ORDER — DOCUSATE SODIUM 100 MG PO CAPS
100.0000 mg | ORAL_CAPSULE | Freq: Two times a day (BID) | ORAL | Status: DC
  Administered 2023-09-02: 100 mg via ORAL
  Filled 2023-09-01: qty 1

## 2023-09-01 MED ORDER — DIPHENHYDRAMINE HCL 50 MG/ML IJ SOLN: 12.5000 mg | INTRAMUSCULAR | Status: DC | PRN

## 2023-09-01 MED ORDER — MISOPROSTOL 25 MCG QUARTER TABLET: 25.0000 ug | ORAL_TABLET | ORAL | Status: DC

## 2023-09-01 MED ORDER — OXYTOCIN 10 UNIT/ML IJ SOLN
INTRAMUSCULAR | Status: DC
Start: 2023-09-01 — End: 2023-09-01
  Filled 2023-09-01: qty 2

## 2023-09-01 MED ORDER — MONTELUKAST SODIUM 10 MG PO TABS
10.0000 mg | ORAL_TABLET | Freq: Every day | ORAL | Status: DC
  Filled 2023-09-01: qty 1

## 2023-09-01 MED ORDER — ONDANSETRON HCL 4 MG/2ML IJ SOLN: 4.0000 mg | Freq: Four times a day (QID) | INTRAMUSCULAR | Status: DC | PRN

## 2023-09-01 MED ORDER — BUPIVACAINE HCL (PF) 0.25 % IJ SOLN
INTRAMUSCULAR | Status: DC | PRN
  Administered 2023-09-01: 4 mL via EPIDURAL
  Administered 2023-09-01: 2 mL via EPIDURAL

## 2023-09-01 MED ORDER — DIPHENHYDRAMINE HCL 25 MG PO CAPS: 25.0000 mg | ORAL_CAPSULE | Freq: Four times a day (QID) | ORAL | Status: DC | PRN

## 2023-09-01 MED ORDER — SOD CITRATE-CITRIC ACID 500-334 MG/5ML PO SOLN: 30.0000 mL | ORAL | Status: DC | PRN

## 2023-09-01 MED ORDER — ACETAMINOPHEN 500 MG PO TABS
1000.0000 mg | ORAL_TABLET | Freq: Four times a day (QID) | ORAL | Status: DC
  Administered 2023-09-01 – 2023-09-02 (×4): 1000 mg via ORAL
  Filled 2023-09-01 (×4): qty 2

## 2023-09-01 MED ORDER — FLUTICASONE FUROATE-VILANTEROL 200-25 MCG/ACT IN AEPB: 1.0000 | INHALATION_SPRAY | Freq: Two times a day (BID) | RESPIRATORY_TRACT | Status: DC | PRN

## 2023-09-01 MED ORDER — PHENYLEPHRINE 80 MCG/ML (10ML) SYRINGE FOR IV PUSH (FOR BLOOD PRESSURE SUPPORT): 80.0000 ug | PREFILLED_SYRINGE | INTRAVENOUS | Status: DC | PRN

## 2023-09-01 MED ORDER — OXYTOCIN-SODIUM CHLORIDE 30-0.9 UT/500ML-% IV SOLN: 1.0000 m[IU]/min | INTRAVENOUS | Status: DC

## 2023-09-01 MED ORDER — OXYTOCIN-SODIUM CHLORIDE 30-0.9 UT/500ML-% IV SOLN: 2.5000 [IU]/h | INTRAVENOUS | Status: DC

## 2023-09-01 MED ORDER — LORATADINE 10 MG PO TABS
10.0000 mg | ORAL_TABLET | Freq: Every day | ORAL | Status: DC
  Filled 2023-09-01 (×2): qty 1

## 2023-09-01 MED ORDER — IBUPROFEN 600 MG PO TABS
600.0000 mg | ORAL_TABLET | Freq: Four times a day (QID) | ORAL | Status: DC
  Administered 2023-09-01 – 2023-09-02 (×4): 600 mg via ORAL
  Filled 2023-09-01 (×4): qty 1

## 2023-09-01 MED ORDER — LIDOCAINE HCL (PF) 1 % IJ SOLN
INTRAMUSCULAR | Status: DC
Start: 2023-09-01 — End: 2023-09-01
  Filled 2023-09-01: qty 30

## 2023-09-01 MED ORDER — WITCH HAZEL-GLYCERIN EX PADS
1.0000 | MEDICATED_PAD | CUTANEOUS | Status: DC | PRN
  Filled 2023-09-01 (×2): qty 100

## 2023-09-01 MED ORDER — OXYCODONE-ACETAMINOPHEN 5-325 MG PO TABS: 2.0000 | ORAL_TABLET | ORAL | Status: DC | PRN

## 2023-09-01 MED ORDER — OXYTOCIN-SODIUM CHLORIDE 30-0.9 UT/500ML-% IV SOLN
INTRAVENOUS | Status: AC
  Administered 2023-09-01: 2 m[IU]/min via INTRAVENOUS
  Filled 2023-09-01: qty 500

## 2023-09-01 MED ORDER — FENTANYL CITRATE (PF) 100 MCG/2ML IJ SOLN: 50.0000 ug | INTRAMUSCULAR | Status: DC | PRN

## 2023-09-01 MED ORDER — COCONUT OIL OIL
1.0000 | TOPICAL_OIL | Status: DC | PRN
  Filled 2023-09-01: qty 7.5

## 2023-09-01 MED ORDER — DIBUCAINE (PERIANAL) 1 % EX OINT: 1.0000 | TOPICAL_OINTMENT | CUTANEOUS | Status: DC | PRN

## 2023-09-01 MED ORDER — SIMETHICONE 80 MG PO CHEW: 80.0000 mg | CHEWABLE_TABLET | ORAL | Status: DC | PRN

## 2023-09-01 MED ORDER — LACTATED RINGERS IV SOLN: 500.0000 mL | INTRAVENOUS | Status: DC | PRN

## 2023-09-01 MED ORDER — FENTANYL-BUPIVACAINE-NACL 0.5-0.125-0.9 MG/250ML-% EP SOLN
EPIDURAL | Status: AC
  Filled 2023-09-01: qty 250

## 2023-09-01 MED ORDER — ONDANSETRON HCL 4 MG/2ML IJ SOLN: 4.0000 mg | INTRAMUSCULAR | Status: DC | PRN

## 2023-09-01 MED ORDER — LACTATED RINGERS IV SOLN: INTRAVENOUS | Status: DC

## 2023-09-01 MED ORDER — OXYTOCIN BOLUS FROM INFUSION
333.0000 mL | Freq: Once | INTRAVENOUS | Status: AC
  Administered 2023-09-01: 333 mL via INTRAVENOUS

## 2023-09-01 MED ORDER — TERBUTALINE SULFATE 1 MG/ML IJ SOLN: 0.2500 mg | Freq: Once | INTRAMUSCULAR | Status: DC | PRN

## 2023-09-01 MED ORDER — LACTATED RINGERS IV SOLN
500.0000 mL | Freq: Once | INTRAVENOUS | Status: AC
  Administered 2023-09-01: 500 mL via INTRAVENOUS

## 2023-09-01 MED ORDER — SODIUM CHLORIDE 0.9 % IV SOLN: 250.0000 mL | INTRAVENOUS | Status: DC | PRN

## 2023-09-01 MED ORDER — ALBUTEROL SULFATE HFA 108 (90 BASE) MCG/ACT IN AERS: 1.0000 | INHALATION_SPRAY | Freq: Four times a day (QID) | RESPIRATORY_TRACT | Status: DC | PRN

## 2023-09-01 MED ORDER — MISOPROSTOL 200 MCG PO TABS
ORAL_TABLET | ORAL | Status: AC
  Filled 2023-09-01: qty 4

## 2023-09-01 MED ORDER — OXYCODONE-ACETAMINOPHEN 5-325 MG PO TABS: 1.0000 | ORAL_TABLET | ORAL | Status: DC | PRN

## 2023-09-01 MED ORDER — BENZOCAINE-MENTHOL 20-0.5 % EX AERO
1.0000 | INHALATION_SPRAY | CUTANEOUS | Status: DC | PRN
  Administered 2023-09-01: 1 via TOPICAL
  Filled 2023-09-01 (×2): qty 56

## 2023-09-01 MED ORDER — FENTANYL-BUPIVACAINE-NACL 0.5-0.125-0.9 MG/250ML-% EP SOLN
12.0000 mL/h | EPIDURAL | Status: DC | PRN
  Administered 2023-09-01: 12 mL/h via EPIDURAL

## 2023-09-01 MED ORDER — PRENATAL MULTIVITAMIN CH
1.0000 | ORAL_TABLET | Freq: Every day | ORAL | Status: DC
  Administered 2023-09-02: 1 via ORAL
  Filled 2023-09-01: qty 1

## 2023-09-01 MED ORDER — SODIUM CHLORIDE 0.9% FLUSH
3.0000 mL | Freq: Two times a day (BID) | INTRAVENOUS | Status: DC
  Administered 2023-09-02: 3 mL via INTRAVENOUS

## 2023-09-01 MED ORDER — MONTELUKAST SODIUM 10 MG PO TABS
10.0000 mg | ORAL_TABLET | Freq: Every day | ORAL | Status: DC
  Filled 2023-09-01 (×2): qty 1

## 2023-09-01 MED ORDER — FERROUS SULFATE 325 (65 FE) MG PO TABS
325.0000 mg | ORAL_TABLET | Freq: Every day | ORAL | Status: DC
  Administered 2023-09-02: 325 mg via ORAL
  Filled 2023-09-01: qty 1

## 2023-09-01 NOTE — Progress Notes (Signed)
 L&D Note    Subjective:  Patient resting comfortably in bed. She has no unusual concerns.  Objective:   Vitals:   09/01/23 0847  Weight: 77.1 kg  Height: 5\' 4"  (1.626 m)    FHR: Baseline: 135 bpm, Variability: moderate, Accels: Present, Decels: late x1, reassured by moderate variability  Toco: occasional  SVE: Dilation: 4 Effacement (%): 50 Station: -1 Presentation: Vertex Exam by:: Mussa Groesbeck, CNM  Medications SCHEDULED MEDICATIONS   misoprostol   25 mcg Oral Q4H   And   misoprostol   25 mcg Vaginal Q4H   oxytocin  40 units in LR 1000 mL  333 mL Intravenous Once    MEDICATION INFUSIONS   lactated ringers      lactated ringers      oxytocin      oxytocin       PRN MEDICATIONS  acetaminophen , fentaNYL  (SUBLIMAZE ) injection, lactated ringers , lidocaine  (PF), ondansetron , oxyCODONE -acetaminophen , oxyCODONE -acetaminophen , sodium citrate-citric acid , terbutaline    Assessment & Plan:  33 y.o. [id number] at [redacted]w[redacted]d admitted for eIOL.  -Labor: SVE: 4/60/-1. Will plan to start IV Pitocin . Plan of care discussed with patient and patient agreeable with plan of care.  -Fetal Well-being: Category II -GBS: negative -Membranes intact -Augmentation: IV Pitocin  augmentation. - Encourage maternal position changes and use of peanut ball.   -Analgesia: regional anesthesia at patient's request   Tina Fischer, CNM  09/01/2023 9:46 AM  [facility]

## 2023-09-01 NOTE — Discharge Summary (Signed)
 Postpartum Discharge Summary  Patient Name: Tina Fischer DOB: 07/02/1990 MRN: [id number]  Date of admission: 09/01/2023 Delivery date:09/01/2023 Delivering provider: LIBOON, JAZMINE Date of discharge: 09/02/2023  Primary OB: [facility] [id number] last menstrual period was 12/05/2022 (exact date). EDC Estimated Date of Delivery: 09/07/23 Gestational Age at Delivery: [redacted]w[redacted]d   Admitting diagnosis: Encounter for elective induction of labor [Z34.90] Intrauterine pregnancy: [redacted]w[redacted]d     Secondary diagnosis:   Principal Problem:   NSVD (normal spontaneous vaginal delivery) Active Problems:   Encounter for elective induction of labor Anxiety and ADHD Mild Persistent Asthma   Discharge Diagnosis: Term Pregnancy Delivered      Hospital course: Onset of Labor With Vaginal Delivery      33 y.o. yo [id number] at [redacted]w[redacted]d was admitted in Active Labor on 09/01/2023. Labor course was uncomplicated.  Membrane Rupture Time/Date: 4:31 PM,09/01/2023  Delivery Method:Vaginal, Spontaneous Operative Delivery:N/A Episiotomy: None Lacerations:  1st degree;Perineal Patient had a postpartum course without complication.  She is ambulating, tolerating a regular diet, passing flatus, and urinating well. Patient is discharged home in stable condition on 09/02/23.  Newborn Data: Birth date:09/01/2023 Birth time:4:31 PM Gender:Female Living status:Living Apgars:8 ,9  Weight:3010 g                                            Post partum procedures:none Augmentation:: Pitocin  Complications: None Delivery Type: spontaneous vaginal delivery Anesthesia: epidural anesthesia Placenta: spontaneous To Pathology: No   Prenatal Labs:  Blood type/Rh A Positive   Antibody screen Negative    Rubella Immune   Varicella Immune   RPR Non reactive   HBsAg Negative   Hep C Non reactive   HIV Negative   GC Negative   Chlamydia Negative   Genetic screening cfDNA negative   1 hour GTT 119  3 hour GTT N/A   GBS Negative      Magnesium  Sulfate received: No BMZ received: No Rhophylac:was not indicated MMR: was not indicated Varivax vaccine given: was not indicated - Tdap vaccine: Given prenatally on 06/20/2023 - Flu vaccine: Declined on 03/15/2023   Transfusion:No  Physical exam  Vitals:   09/01/23 2200 09/02/23 0050 09/02/23 0420 09/02/23 0829  BP: 104/60 99/66 (!) 95/55 (!) 97/56  Pulse: 68 76 62 65  Resp: 18 18 16 18   Temp: 98.6 F (37 C) 98.6 F (37 C)  98 F (36.7 C)  TempSrc: Oral Oral  Oral  SpO2: 99% 98% 97% 100%  Weight:      Height:       General: alert, cooperative, and no distress Lochia: appropriate Uterine Fundus: firm Perineum: minimal edema/intact DVT Evaluation: No evidence of DVT seen on physical exam.  Labs: Lab Results  Component Value Date   WBC 11.3 (H) 09/02/2023   HGB 11.6 (L) 09/02/2023   HCT 33.4 (L) 09/02/2023   MCV 94.6 09/02/2023   PLT 184 09/02/2023      Latest Ref Rng & Units 12/14/2022    2:55 PM  CMP  Glucose 70 - 99 mg/dL 84   BUN 6 - 23 mg/dL 8   Creatinine 0.86 - 5.78 mg/dL 4.69   Sodium 629 - 528 mEq/L 138   Potassium 3.5 - 5.1 mEq/L 4.1   Chloride 96 - 112 mEq/L 102   CO2 19 - 32 mEq/L 25   Calcium  8.4 - 10.5 mg/dL 9.9   Total  Protein 6.0 - 8.3 g/dL 6.7   Total Bilirubin 0.2 - 1.2 mg/dL 0.8   Alkaline Phos 39 - 117 U/L 72   AST 0 - 37 U/L 17   ALT 0 - 35 U/L 14    Edinburgh Score:    01/12/2022    8:45 AM  Edinburgh Postnatal Depression Scale Screening Tool  I have been able to laugh and see the funny side of things. 0  I have looked forward with enjoyment to things. 0  I have blamed myself unnecessarily when things went wrong. 0  I have been anxious or worried for no good reason. 0  I have felt scared or panicky for no good reason. 0  Things have been getting on top of me. 0  I have been so unhappy that I have had difficulty sleeping. 0  I have felt sad or miserable. 0  I have been so unhappy that I have  been crying. 0  The thought of harming myself has occurred to me. 0  Edinburgh Postnatal Depression Scale Total 0    Risk assessment for postpartum VTE and prophylactic treatment: Very high risk factors: None High risk factors: None Moderate risk factors: None  Postpartum VTE prophylaxis with LMWH not indicated  After visit meds:  Allergies as of 09/02/2023   No Known Allergies      Medication List     STOP taking these medications    amoxicillin -clavulanate 875-125 MG tablet Commonly known as: AUGMENTIN        TAKE these medications    acetaminophen  500 MG tablet Commonly known as: TYLENOL  Take 2 tablets (1,000 mg total) by mouth every 6 (six) hours as needed for mild pain (pain score 1-3) or fever.   albuterol  108 (90 Base) MCG/ACT inhaler Commonly known as: ProAir  HFA Inhale 1-2 puffs into the lungs every 6 (six) hours as needed for wheezing or shortness of breath.   benzocaine -Menthol  20-0.5 % Aero Commonly known as: DERMOPLAST Apply 1 Application topically as needed for irritation (perineal discomfort).   cetirizine 10 MG tablet Commonly known as: ZYRTEC Take 10 mg by mouth daily.   docusate sodium  100 MG capsule Commonly known as: COLACE Take 1 capsule (100 mg total) by mouth daily as needed for mild constipation.   ferrous sulfate  325 (65 FE) MG tablet Take 1 tablet (325 mg total) by mouth daily with breakfast.   ibuprofen  600 MG tablet Commonly known as: ADVIL  Take 1 tablet (600 mg total) by mouth every 6 (six) hours as needed for cramping, mild pain (pain score 1-3) or fever.   montelukast  10 MG tablet Commonly known as: SINGULAIR  TAKE 1 TABLET (10 MG TOTAL) BY MOUTH AT BEDTIME. FOR ALLERGIES AND ASTHMA.   Prenatal (w/Iron & FA) 27-0.8 MG Tabs Take 1 tablet by mouth daily.   Qvar  RediHaler 40 MCG/ACT inhaler Generic drug: beclomethasone Inhale 2 puffs into the lungs 2 (two) times daily.   simethicone  80 MG chewable tablet Commonly known as:  MYLICON Chew 1 tablet (80 mg total) by mouth 4 (four) times daily as needed for flatulence.   witch hazel-glycerin  pad Commonly known as: TUCKS Apply 1 Application topically as needed for hemorrhoids.       Discharge home in stable condition Infant Feeding: Breast Infant Disposition:home with mother Discharge instruction: per After Visit Summary and Postpartum booklet. Activity: Advance as tolerated. Pelvic rest for 6 weeks.  Diet: routine diet Anticipated Birth Control:  Contraceptives: Natural Family Planning Postpartum Appointment:6 weeks Additional Postpartum F/U:  None  Future Appointments:No future appointments. Follow up Visit:  Follow-up Information     Liboon, Jazmine, CNM Follow up in 6 week(s).   Specialty: Certified Nurse Midwife Why: postpartum visit Contact information: [address] [place] [address] [phone]                 Plan:  DORETHEA STRUBEL was discharged to home in good condition. Follow-up appointment as directed.    Signed:  Auston Left, CNM 09/02/2023 11:53 AM

## 2023-09-01 NOTE — H&P (Addendum)
 OB History & Physical   History of Present Illness:   Chief Complaint: eIOL   HPI:  Tina Fischer is a 33 y.o. [id number] female at [redacted]w[redacted]d, Patient's last menstrual period was 12/05/2022 (exact date)., consistent with US  at [redacted]w[redacted]d, with Estimated Date of Delivery: 09/07/23.  She presents to L&D for eIOL.   Reports active fetal movement  Contractions: occasional  LOF/SROM: Membranes intact  Vaginal bleeding: None  Factors complicating pregnancy:  Anxiety and ADHD Mild Persistent Asthma    Patient Active Problem List   Diagnosis Date Noted   Encounter for elective induction of labor 09/01/2023   Uterine contractions 08/29/2023   Viral syndrome 02/28/2023   GAD (generalized anxiety disorder) 11/26/2020   Lipoma 02/23/2018   Mild persistent asthma 11/16/2017   Allergic rhinitis due to allergen 11/16/2017   ADHD 11/16/2017   Migraines 11/16/2016    Prenatal Transfer Tool  Maternal Diabetes: No Genetic Screening: Normal Maternal Ultrasounds/Referrals: Normal Fetal Ultrasounds or other Referrals:  None Maternal Substance Abuse:  No Significant Maternal Medications: None  Significant Maternal Lab Results: Group B Strep negative  Maternal Medical History:   Past Medical History:  Diagnosis Date   Allergy    Asthma    Asthma during pregnancy 10/10/2018   Frequent headaches    Lipoma    Migraines    NSVD (normal spontaneous vaginal delivery) 01/12/2022    Past Surgical History:  Procedure Laterality Date   TONSILLECTOMY AND ADENOIDECTOMY  2008   WISDOM TOOTH EXTRACTION  2017    No Known Allergies  Prior to Admission medications   Medication Sig Start Date End Date Taking? Authorizing Provider  albuterol  (PROAIR  HFA) 108 (90 Base) MCG/ACT inhaler Inhale 1-2 puffs into the lungs every 6 (six) hours as needed for wheezing or shortness of breath. 03/01/23  Yes Clark, Katherine K, NP  cetirizine (ZYRTEC) 10 MG tablet Take 10 mg by mouth daily.   Yes [provider]  montelukast  (SINGULAIR ) 10 MG tablet TAKE 1 TABLET (10 MG TOTAL) BY MOUTH AT BEDTIME. FOR ALLERGIES AND ASTHMA. 12/25/22  Yes Clark, Katherine K, NP  Prenatal Multivit-Min-Fe-FA (PRENATAL, W/IRON & FA,) 27-0.8 MG TABS Take 1 tablet by mouth daily.   Yes [provider]  amoxicillin -clavulanate (AUGMENTIN ) 875-125 MG tablet Take 1 tablet by mouth 2 (two) times daily. Patient not taking: Reported on 09/01/2023 04/11/23   Dugal, Tabitha, FNP  beclomethasone (QVAR  REDIHALER) 40 MCG/ACT inhaler Inhale 2 puffs into the lungs 2 (two) times daily. Patient not taking: Reported on 04/11/2023 03/20/23   Gabriel John, NP     Prenatal care site:  [facility]  OB History  Gravida Para Term Preterm AB Living  6 2 2  0 2 2  SAB IAB Ectopic Multiple Live Births  2 0 0 0 2    # Outcome Date GA Lbr Len/2nd Weight Sex Type Anes PTL Lv  6 Current           5 Term 2023     Vag-Spont   LIV  4 Term 05/28/19 [redacted]w[redacted]d 03:30 / 01:02 2980 g F Vag-Spont EPI  LIV     Name: Tina Fischer     Apgar1: 9  Apgar5: 9  3 SAB 2016          2 Gravida           1 SAB              Social History: She  reports that she quit  smoking about 5 years ago. Her smoking use included cigarettes. She has never used smokeless tobacco. She reports current alcohol use. She reports that she does not currently use drugs after having used the following drugs: Marijuana.  Family History: family history includes Cancer (age of onset: 28) in her maternal grandmother; Cancer (age of onset: 43) in her paternal grandfather; Colon polyps in her mother; Depression in her paternal grandfather and paternal grandmother; Early death in her maternal grandfather and maternal grandmother.   Review of Systems: A full review of systems was performed and negative except as noted in the HPI.     Physical Exam:  Vital Signs: Ht 5\' 4"  (1.626 m)   Wt 77.1 kg   LMP 12/05/2022 (Exact Date)   BMI 29.18 kg/m   General: no acute  distress.  HEENT: normocephalic, atraumatic Heart: regular rate & rhythm Lungs: normal respiratory effort Abdomen: soft, gravid, non-tender Pelvic:   External: Normal external female genitalia  Cervix: Dilation: 4 / Effacement (%): 50 / Station: -1    Extremities: non-tender, symmetric, no edema bilaterally.  DTRs: 2+  Neurologic: Alert & oriented x 3.    Results for orders placed or performed during the hospital encounter of 09/01/23 (from the past 24 hours)  CBC     Status: Abnormal   Collection Time: 09/01/23  9:13 AM  Result Value Ref Range   WBC 11.1 (H) 4.0 - 10.5 K/uL   RBC 3.81 (L) 3.87 - 5.11 MIL/uL   Hemoglobin 12.7 12.0 - 15.0 g/dL   HCT 40.9 (L) 81.1 - 91.4 %   MCV 91.9 80.0 - 100.0 fL   MCH 33.3 26.0 - 34.0 pg   MCHC 36.3 (H) 30.0 - 36.0 g/dL   RDW 78.2 95.6 - 21.3 %   Platelets 221 150 - 400 K/uL   nRBC 0.0 0.0 - 0.2 %  Type and screen     Status: None (Preliminary result)   Collection Time: 09/01/23  9:13 AM  Result Value Ref Range   ABO/RH(D) PENDING    Antibody Screen PENDING    Sample Expiration      09/04/2023,2359 Performed at [facility], [address] [place], [place] [address]     Pertinent Results:  Prenatal Labs: Blood type/Rh A Positive   Antibody screen Negative    Rubella Immune   Varicella Immune   RPR Non reactive   HBsAg Negative   Hep C Non reactive   HIV Negative   GC Negative   Chlamydia Negative   Genetic screening cfDNA negative   1 hour GTT 119  3 hour GTT N/A  GBS Negative    FHT:  FHR: 135 bpm, variability: moderate,  accelerations:  Present,  decelerations:  Present --> late decel x 1, reassured by moderate variability  Category/reactivity:  Category II UC:   occasional    Cephalic by SVE   Assessment:  Tina Fischer is a 33 y.o. [id number] female at [redacted]w[redacted]d with eIOL.   Plan:  1. Admit to Labor & Delivery - Consents reviewed and obtained -  T.Schermerhorn,MD notified of admission and plan of care    2. Fetal Well being  - Fetal Tracing: Category II - Group B Streptococcus ppx not indicated: GBS negative - Presentation: Cephalic confirmed by SVE   3. Routine OB: - Prenatal labs reviewed, as above - Rh positive - CBC, T&S, RPR on admit - Labor diet, continuous IV fluids  4. Monitoring of labor  - Contractions monitored with  external toco - Pelvis adequate for trial of labor  - SVE: 4/60/-1. Plan for augmentation with IV Pitocin  - Augmentation with AROM as appropriate  - Plan for  continuous fetal monitoring - Maternal pain control as desired; planning regional anesthesia - Anticipate vaginal delivery  5. Post Partum Planning: - Infant feeding: breast feeding - Contraception: natural family planning (NFP) - Flu vaccine: Declined on 03/15/2023 - Tdap vaccine: Given prenatally on 06/20/2023  6. Mild Persistent Asthma - Singulair  10 mg at bedtime and fluticasone  1-2 puffs BID PRN - Avoid administering Hemabate for PPH  7. Anxiety and ADHD - No medication during pregnancy - Zoloft  prior to pregnancy. Declined Zoloft  postpartum (09/01/2023)  Blu Lori, CNM 09/01/23 9:51 AM  Jaydi Bray, CNM Certified Nurse Midwife [facility]

## 2023-09-01 NOTE — Anesthesia Procedure Notes (Signed)
 Epidural Patient location during procedure: OB  Staffing Anesthesiologist: Dorothey Gate, MD Resident/CRNA: Philippe Brazen, CRNA Performed: resident/CRNA   Preanesthetic Checklist Completed: patient identified, IV checked, site marked, risks and benefits discussed, surgical consent, monitors and equipment checked, pre-op evaluation and timeout performed  Epidural Patient position: sitting Prep: ChloraPrep and site prepped and draped Patient monitoring: heart rate, continuous pulse ox and blood pressure Approach: midline Location: L4-L5 Injection technique: LOR saline  Needle:  Needle type: Tuohy  Needle gauge: 17 G Needle length: 9 cm and 9 Needle insertion depth: 6 cm Catheter type: closed end flexible Catheter size: 19 Gauge Catheter at skin depth: 11 cm Test dose: negative and 1.5% lidocaine  with Epi 1:200 K  Assessment Events: blood not aspirated, no cerebrospinal fluid, injection not painful, no injection resistance, no paresthesia and negative IV test  Additional Notes   Patient tolerated the insertion well without complications.Reason for block:procedure for pain

## 2023-09-01 NOTE — Anesthesia Preprocedure Evaluation (Signed)
 Anesthesia Evaluation  Patient identified by MRN, date of birth, ID band Patient awake    Reviewed: Allergy & Precautions, H&P , NPO status , Patient's Chart, lab work & pertinent test results  Airway Mallampati: II  TM Distance: >3 FB Neck ROM: full    Dental  (+) Dental Advidsory Given   Pulmonary asthma , former smoker          Cardiovascular      Neuro/Psych  Headaches PSYCHIATRIC DISORDERS Anxiety        GI/Hepatic Neg liver ROS,GERD  ,,  Endo/Other  negative endocrine ROS    Renal/GU negative Renal ROS  negative genitourinary   Musculoskeletal   Abdominal   Peds  Hematology negative hematology ROS (+)   Anesthesia Other Findings   Reproductive/Obstetrics (+) Pregnancy and Breast feeding                              Anesthesia Physical Anesthesia Plan  ASA: 2  Anesthesia Plan: Epidural   Post-op Pain Management:    Induction:   PONV Risk Score and Plan:   Airway Management Planned:   Additional Equipment:   Intra-op Plan:   Post-operative Plan:   Informed Consent: I have reviewed the patients History and Physical, chart, labs and discussed the procedure including the risks, benefits and alternatives for the proposed anesthesia with the patient or authorized representative who has indicated his/her understanding and acceptance.       Plan Discussed with: CRNA  Anesthesia Plan Comments:        Anesthesia Quick Evaluation

## 2023-09-02 LAB — CBC
HCT: 33.4 % — ABNORMAL LOW (ref 36.0–46.0)
Hemoglobin: 11.6 g/dL — ABNORMAL LOW (ref 12.0–15.0)
MCH: 32.9 pg (ref 26.0–34.0)
MCHC: 34.7 g/dL (ref 30.0–36.0)
MCV: 94.6 fL (ref 80.0–100.0)
Platelets: 184 10*3/uL (ref 150–400)
RBC: 3.53 MIL/uL — ABNORMAL LOW (ref 3.87–5.11)
RDW: 13.1 % (ref 11.5–15.5)
WBC: 11.3 10*3/uL — ABNORMAL HIGH (ref 4.0–10.5)
nRBC: 0 % (ref 0.0–0.2)

## 2023-09-02 LAB — RPR: RPR Ser Ql: NONREACTIVE

## 2023-09-02 MED ORDER — IBUPROFEN 600 MG PO TABS: 600.0000 mg | ORAL_TABLET | Freq: Four times a day (QID) | ORAL | 0 refills | Status: DC | PRN

## 2023-09-02 MED ORDER — BENZOCAINE-MENTHOL 20-0.5 % EX AERO
1.0000 | INHALATION_SPRAY | CUTANEOUS | 0 refills | Status: DC | PRN
Start: 2023-09-02 — End: 2023-12-20

## 2023-09-02 MED ORDER — DOCUSATE SODIUM 100 MG PO CAPS
100.0000 mg | ORAL_CAPSULE | Freq: Every day | ORAL | 0 refills | Status: DC | PRN
Start: 2023-09-02 — End: 2023-12-20

## 2023-09-02 MED ORDER — WITCH HAZEL-GLYCERIN EX PADS: 1.0000 | MEDICATED_PAD | CUTANEOUS | 0 refills | Status: DC | PRN

## 2023-09-02 MED ORDER — FERROUS SULFATE 325 (65 FE) MG PO TABS: 325.0000 mg | ORAL_TABLET | Freq: Every day | ORAL | 3 refills | Status: DC

## 2023-09-02 MED ORDER — ACETAMINOPHEN 500 MG PO TABS: 1000.0000 mg | ORAL_TABLET | Freq: Four times a day (QID) | ORAL | 0 refills | Status: DC | PRN

## 2023-09-02 MED ORDER — SIMETHICONE 80 MG PO CHEW: 80.0000 mg | CHEWABLE_TABLET | Freq: Four times a day (QID) | ORAL | 0 refills | Status: DC | PRN

## 2023-09-02 NOTE — Lactation Note (Signed)
 This note was copied from a baby's chart. Lactation Consultation Note  Patient Name: Tina Fischer [id number] Date: 09/02/2023 Age:35 hours Reason for consult: Initial assessment;Mother's request;Term;Breastfeeding assistance;RN request;Other (Comment) (evaluate correct pump flange size)   Maternal Data This is mom's 3rd baby, SVD. Mom with history of anxiety and ADHD.  On initial visit assisted mom with breastfeeding. Has patient been taught Hand Expression?: Yes Does the patient have breastfeeding experience prior to this delivery?: Yes How long did the patient breastfeed?: Mom breastfed/pumped for 2nd baby, 1st baby had latch problems and did not breastfeed.  Feeding Mother's Current Feeding Choice: Breast Milk Provided mom with tips and strategies to maximize position and latch techniques. Reviewed how to wake a sleepy baby. Recommended mom remove the baby's clothes to awaken the baby. Baby did awaken and was showing feeding cues.Baby with mom's encouragement latched with mom using "sandwich" technique to facilitate a deep latch. Baby fed well, multiple audible swallows noted by mom, dad, and LC. LATCH Score Latch: Grasps breast easily, tongue down, lips flanged, rhythmical sucking.  Audible Swallowing: Spontaneous and intermittent  Type of Nipple: Everted at rest and after stimulation  Comfort (Breast/Nipple): Soft / non-tender  Hold (Positioning): Assistance needed to correctly position infant at breast and maintain latch.  LATCH Score: 9   Lactation Tools Discussed/Used  Breastshield flange size(#24 mm), maximizing use of mom's personal DEBP.  Interventions Interventions: Breast feeding basics reviewed;Assisted with latch;Breast massage;Hand express;Breast compression;Adjust position;Support pillows;Position options;Coconut oil;Hand pump;DEBP;Education (Measured mom for correct breastshield fit for Medela pump.)Reviewed what to expect in the first days when  breastfeeding: 8-12 feeds in 24 hours, how to know the baby is getting enough, cluster feeding, feeding cues, how to wake a sleepy baby. Also, discussed management of oversupply as mom has history of oversupply.  Discharge Discharge Education: Engorgement and breast care;Warning signs for feeding baby;Outpatient recommendation Pump: Personal;DEBP;Manual  Consult Status Consult Status: Complete  Update provided to care nurse.  Angelica Kemp 09/02/2023, 1:12 PM

## 2023-09-02 NOTE — TOC Transition Note (Signed)
 Transition of Care [facility]) - Discharge Note   Patient Details  Name: Tina Fischer MRN: [id number] Date of Birth: [birth year]  [facility] of [facility]) CM/SW Contact:  Crayton Docker, RN 09/02/2023, 12:43 PM   Clinical Narrative:     Discharge orders for home/self care. Patient's spouse at bedside and will provide transportation home. No voiced concerns or questions. Patient stated will keep 6 week follow up appointment.   Final next level of care: Home/Self Care Barriers to Discharge: No Barriers Identified   Patient Goals and CMS Choice    Home/self care   Discharge Placement    Home/self care            Discharge Plan and Services Additional resources added to the After Visit Summary for      Social Drivers of Health (SDOH) Interventions SDOH Screenings   Food Insecurity: No Food Insecurity (09/01/2023)  Housing: Low Risk  (09/01/2023)  Transportation Needs: No Transportation Needs (09/01/2023)  Utilities: Not At Risk (09/01/2023)  Depression (PHQ2-9): Low Risk  (12/14/2022)  Tobacco Use: Medium Risk (09/01/2023)     Readmission Risk Interventions     No data to display

## 2023-09-02 NOTE — Anesthesia Postprocedure Evaluation (Signed)
 Anesthesia Post Note  Patient: Tina Fischer  Procedure(s) Performed: AN AD HOC LABOR EPIDURAL  Patient location during evaluation: Mother Baby Anesthesia Type: Epidural Level of consciousness: awake and alert Pain management: pain level controlled Vital Signs Assessment: post-procedure vital signs reviewed and stable Respiratory status: spontaneous breathing, nonlabored ventilation and respiratory function stable Cardiovascular status: stable Postop Assessment: no headache, no backache, able to ambulate and adequate PO intake Anesthetic complications: no   No notable events documented.   Last Vitals:  Vitals:   09/02/23 0420 09/02/23 0829  BP: (!) 95/55 (!) 97/56  Pulse: 62 65  Resp: 16 18  Temp:  36.7 C  SpO2: 97% 100%    Last Pain:  Vitals:   09/02/23 0829  TempSrc: Oral  PainSc:                  [name]

## 2023-09-02 NOTE — Progress Notes (Signed)
Patient discharged. Discharge instructions given. Patient verbalizes understanding. Transported by RN. 

## 2023-10-16 DIAGNOSIS — Z124 Encounter for screening for malignant neoplasm of cervix: Secondary | ICD-10-CM | POA: Diagnosis not present

## 2023-10-16 DIAGNOSIS — R109 Unspecified abdominal pain: Secondary | ICD-10-CM | POA: Diagnosis not present

## 2023-10-16 DIAGNOSIS — Z1332 Encounter for screening for maternal depression: Secondary | ICD-10-CM | POA: Diagnosis not present

## 2023-11-16 DIAGNOSIS — F411 Generalized anxiety disorder: Secondary | ICD-10-CM

## 2023-11-16 MED ORDER — SERTRALINE HCL 25 MG PO TABS: 25.0000 mg | ORAL_TABLET | Freq: Every day | ORAL | 0 refills | Status: DC

## 2023-11-16 NOTE — Telephone Encounter (Signed)
 Can We find out how many pills she has?  Has she been taking the 25 mg dose or the 50 mg dose, 1 pill versus 2 pills?  I can provide her with a few tablets, but she is due for follow-up in September.  Last CPE was 12/14/2022.

## 2023-11-16 NOTE — Telephone Encounter (Signed)
 Spoke with patient she has 15 pills on hand. She has been taking the 25mg  dose.  Scheduled CPE for 12/20/23. [facility] for refill.

## 2023-11-16 NOTE — Telephone Encounter (Signed)
 Sounds good.  I will send a 30-day supply of the 25 mg dose to her pharmacy.  We can discuss a dose increase during her visit.

## 2023-12-15 ENCOUNTER — Other Ambulatory Visit: Payer: Self-pay | Admitting: Primary Care

## 2023-12-15 DIAGNOSIS — F411 Generalized anxiety disorder: Secondary | ICD-10-CM

## 2023-12-20 ENCOUNTER — Ambulatory Visit (INDEPENDENT_AMBULATORY_CARE_PROVIDER_SITE_OTHER): Admitting: Primary Care

## 2023-12-20 ENCOUNTER — Encounter: Payer: Self-pay | Admitting: Primary Care

## 2023-12-20 VITALS — BP 118/72 | HR 71 | Temp 97.8°F | Ht 64.0 in | Wt 146.0 lb

## 2023-12-20 DIAGNOSIS — R109 Unspecified abdominal pain: Secondary | ICD-10-CM | POA: Insufficient documentation

## 2023-12-20 DIAGNOSIS — J4530 Mild persistent asthma, uncomplicated: Secondary | ICD-10-CM

## 2023-12-20 DIAGNOSIS — F411 Generalized anxiety disorder: Secondary | ICD-10-CM

## 2023-12-20 DIAGNOSIS — Z0000 Encounter for general adult medical examination without abnormal findings: Secondary | ICD-10-CM | POA: Diagnosis not present

## 2023-12-20 DIAGNOSIS — G43009 Migraine without aura, not intractable, without status migrainosus: Secondary | ICD-10-CM

## 2023-12-20 DIAGNOSIS — J3089 Other allergic rhinitis: Secondary | ICD-10-CM

## 2023-12-20 NOTE — Assessment & Plan Note (Signed)
 Immunizations UTD. Declines influenza vaccine.  Pap smear UTD.  Discussed the importance of a healthy diet and regular exercise in order for weight loss, and to reduce the risk of further co-morbidity.  Exam stable. Declines labs today  Follow up in 1 year for repeat physical.

## 2023-12-20 NOTE — Assessment & Plan Note (Signed)
 Controlled.  Continue to monitor.

## 2023-12-20 NOTE — Assessment & Plan Note (Signed)
Controlled.  Continue sertraline 125 mg daily. 

## 2023-12-20 NOTE — Assessment & Plan Note (Signed)
 Waxes and wanes.  She will set up an appointment with allergist.  Continue Singulair  10 mg HS. Continue albuterol  inhaler PRN.

## 2023-12-20 NOTE — Patient Instructions (Signed)
 It was a pleasure to see you today!

## 2023-12-20 NOTE — Progress Notes (Signed)
 Subjective:    Patient ID: Tina Fischer, female    DOB: 05/01/1990, 33 y.o.   MRN: [id number]  Tina Fischer is a very pleasant 33 y.o. female who presents today for complete physical and follow up of chronic conditions.  She would also like to discuss upper abdominal pain. Occurred initially 3 weeks post partum. She describes her pain as a severe abdominal cramping like contraction occurring multiple times daily. Since then her cramping and symptoms have improved and are less frequent and intense. Symptoms occurred with and without eating. Her last episode was 2-3 weeks ago.  Immunizations: -Tetanus: Completed in 2025 -Influenza: Declines influenza vaccine.   -HPV: Completed series   Diet: Fair diet.  Exercise: No regular exercise.  Eye exam: Completes annually  Dental exam: Completes semi-annually    Pap Smear: Completes per GYN   BP Readings from Last 3 Encounters:  12/20/23 118/72  09/02/23 99/60  08/29/23 112/60    She began taking 1/2 tablet of sertraline  as she felt shaky and more anxious on the 25 mg dose. She's doing much better on the 12.5 mg dose.      Review of Systems  Constitutional:  Negative for unexpected weight change.  HENT:  Negative for rhinorrhea.   Respiratory:  Negative for cough and shortness of breath.   Cardiovascular:  Negative for chest pain.  Gastrointestinal:  Negative for constipation and diarrhea.  Genitourinary:  Negative for difficulty urinating.  Musculoskeletal:  Negative for arthralgias and myalgias.  Skin:  Negative for rash.  Allergic/Immunologic: Negative for environmental allergies.  Neurological:  Negative for dizziness and headaches.  Psychiatric/Behavioral:  The patient is not nervous/anxious.          Past Medical History:  Diagnosis Date   Allergy    Asthma    Asthma during pregnancy 10/10/2018   Frequent headaches    Lipoma    Migraines    NSVD (normal spontaneous vaginal delivery) 01/12/2022     Social History   Socioeconomic History   Marital status: Married    Spouse name: Redell   Number of children: Not on file   Years of education: Not on file   Highest education level: Not on file  Occupational History   Occupation: Unemployed   Occupation: Therapist, nutritional  Tobacco Use   Smoking status: Former    Current packs/day: 0.00    Types: Cigarettes    Quit date: 05/23/2018    Years since quitting: 5.5   Smokeless tobacco: Never  Vaping Use   Vaping status: Never Used  Substance and Sexual Activity   Alcohol use: Yes    Comment: Occ   Drug use: Not Currently    Types: Marijuana    Comment: Stopped with current pregnancy 2020   Sexual activity: Yes    Birth control/protection: Coitus interruptus  Other Topics Concern   Not on file  Social History Narrative   Married.   No children.   Works for [employer], yoga, Tax adviser.    Social Drivers of Corporate investment banker Strain: Not on file  Food Insecurity: No Food Insecurity (09/01/2023)   Hunger Vital Sign    Worried About Running Out of Food in the Last Year: Never true    Ran Out of Food in the Last Year: Never true  Transportation Needs: No Transportation Needs (09/01/2023)   PRAPARE - Transportation    Lack of Transportation (Medical): No    Lack of  Transportation (Non-Medical): No  Physical Activity: Not on file  Stress: Not on file  Social Connections: Not on file  Intimate Partner Violence: Not At Risk (09/01/2023)   Humiliation, Afraid, Rape, and Kick questionnaire    Fear of Current or Ex-Partner: No    Emotionally Abused: No    Physically Abused: No    Sexually Abused: No    Past Surgical History:  Procedure Laterality Date   TONSILLECTOMY AND ADENOIDECTOMY  2008   WISDOM TOOTH EXTRACTION  2017    Family History  Problem Relation Age of Onset   Colon polyps Mother    Cancer Maternal Grandmother 49       breast   Early death Maternal Grandmother     Early death Maternal Grandfather    Depression Paternal Grandmother    Depression Paternal Grandfather    Cancer Paternal Grandfather 64       Prostated    No Known Allergies  Current Outpatient Medications on File Prior to Visit  Medication Sig Dispense Refill   albuterol  (PROAIR  HFA) 108 (90 Base) MCG/ACT inhaler Inhale 1-2 puffs into the lungs every 6 (six) hours as needed for wheezing or shortness of breath. 8.5 each 0   cetirizine (ZYRTEC) 10 MG tablet Take 10 mg by mouth daily.     montelukast  (SINGULAIR ) 10 MG tablet TAKE 1 TABLET (10 MG TOTAL) BY MOUTH AT BEDTIME. FOR ALLERGIES AND ASTHMA. 90 tablet 3   Prenatal Multivit-Min-Fe-FA (PRENATAL, W/IRON & FA,) 27-0.8 MG TABS Take 1 tablet by mouth daily.     sertraline  (ZOLOFT ) 25 MG tablet TAKE 1 TABLET (25 MG TOTAL) BY MOUTH DAILY. FOR ANXIETY 30 tablet 0   beclomethasone (QVAR  REDIHALER) 40 MCG/ACT inhaler Inhale 2 puffs into the lungs 2 (two) times daily. (Patient not taking: Reported on 12/20/2023) 1 each 0   No current facility-administered medications on file prior to visit.    BP 118/72   Pulse 71   Temp 97.8 F (36.6 C) (Temporal)   Ht 5' 4 (1.626 m)   Wt 146 lb (66.2 kg)   LMP  (LMP Unknown)   SpO2 98%   Breastfeeding Yes   BMI 25.06 kg/m  Objective:   Physical Exam HENT:     Right Ear: Tympanic membrane and ear canal normal.     Left Ear: Tympanic membrane and ear canal normal.  Eyes:     Pupils: Pupils are equal, round, and reactive to light.  Cardiovascular:     Rate and Rhythm: Normal rate and regular rhythm.  Pulmonary:     Effort: Pulmonary effort is normal.     Breath sounds: Normal breath sounds.  Abdominal:     General: Bowel sounds are normal.     Palpations: Abdomen is soft.     Tenderness: There is no abdominal tenderness.  Musculoskeletal:        General: Normal range of motion.     Cervical back: Neck supple.  Skin:    General: Skin is warm and dry.  Neurological:     Mental Status: She  is alert and oriented to person, place, and time.     Cranial Nerves: No cranial nerve deficit.     Deep Tendon Reflexes:     Reflex Scores:      Patellar reflexes are 2+ on the right side and 2+ on the left side. Psychiatric:        Mood and Affect: Mood normal.     Physical Exam  Assessment & Plan:  Preventative health care Assessment & Plan: Immunizations UTD. Declines influenza vaccine.  Pap smear UTD.  Discussed the importance of a healthy diet and regular exercise in order for weight loss, and to reduce the risk of further co-morbidity.  Exam stable. Declines labs today  Follow up in 1 year for repeat physical.    Migraine without aura and without status migrainosus, not intractable Assessment & Plan: Controlled.  Continue to monitor.    Mild persistent asthma without complication Assessment & Plan: Waxes and wanes.  She will set up an appointment with allergist.  Continue Singulair  10 mg HS. Continue albuterol  inhaler PRN.  Orders: -     Ambulatory referral to Allergy  Non-seasonal allergic rhinitis due to other allergic trigger -     Ambulatory referral to Allergy  GAD (generalized anxiety disorder) Assessment & Plan: Controlled.  Continue sertraline  12.5 mg daily.   Abdominal cramping Assessment & Plan: Improved.  We discussed differentials including musculoskeletal contraction from birth, gallbladder involvement. Fortunately, her symptoms have significantly improved.  If they return we will consider further testing such as right upper quadrant abdominal ultrasound.  She will update.     Assessment and Plan Assessment & Plan         Comer MARLA Gaskins, NP   History of Present Illness

## 2023-12-20 NOTE — Assessment & Plan Note (Signed)
 Improved.  We discussed differentials including musculoskeletal contraction from birth, gallbladder involvement. Fortunately, her symptoms have significantly improved.  If they return we will consider further testing such as right upper quadrant abdominal ultrasound.  She will update.

## 2024-01-30 ENCOUNTER — Encounter: Payer: Self-pay | Admitting: *Deleted

## 2024-02-06 ENCOUNTER — Other Ambulatory Visit: Payer: Self-pay | Admitting: Primary Care

## 2024-02-06 DIAGNOSIS — J3089 Other allergic rhinitis: Secondary | ICD-10-CM

## 2024-02-06 DIAGNOSIS — J4520 Mild intermittent asthma, uncomplicated: Secondary | ICD-10-CM

## 2024-05-06 ENCOUNTER — Ambulatory Visit: Payer: Self-pay
